# Patient Record
Sex: Male | Born: 1965 | Race: Black or African American | Hispanic: No | Marital: Single | Smoking: Smoker, current status unknown
Health system: Southern US, Community
[De-identification: ages and names within clinical notes are randomized; demographics above are authoritative.]

## PROBLEM LIST (undated history)

## (undated) DIAGNOSIS — F319 Bipolar disorder, unspecified: Secondary | ICD-10-CM

## (undated) DIAGNOSIS — R569 Unspecified convulsions: Secondary | ICD-10-CM

## (undated) DIAGNOSIS — I10 Essential (primary) hypertension: Secondary | ICD-10-CM

## (undated) DIAGNOSIS — G459 Transient cerebral ischemic attack, unspecified: Secondary | ICD-10-CM

## (undated) HISTORY — PX: APPENDECTOMY: SHX54

---

## 2011-11-23 DEATH — deceased

## 2016-05-15 ENCOUNTER — Encounter (HOSPITAL_COMMUNITY): Payer: Self-pay | Admitting: Emergency Medicine

## 2016-05-15 ENCOUNTER — Emergency Department (HOSPITAL_COMMUNITY): Payer: Self-pay

## 2016-05-15 ENCOUNTER — Inpatient Hospital Stay (HOSPITAL_COMMUNITY)
Admission: EM | Admit: 2016-05-15 | Discharge: 2016-05-17 | DRG: 880 | Disposition: A | Discharge status: Home / Self Care | Payer: Self-pay | Attending: Neurology | Admitting: Neurology

## 2016-05-15 DIAGNOSIS — G4700 Insomnia, unspecified: Secondary | ICD-10-CM | POA: Diagnosis present

## 2016-05-15 DIAGNOSIS — N189 Chronic kidney disease, unspecified: Secondary | ICD-10-CM | POA: Diagnosis present

## 2016-05-15 DIAGNOSIS — G839 Paralytic syndrome, unspecified: Secondary | ICD-10-CM | POA: Diagnosis present

## 2016-05-15 DIAGNOSIS — F17200 Nicotine dependence, unspecified, uncomplicated: Secondary | ICD-10-CM | POA: Diagnosis present

## 2016-05-15 DIAGNOSIS — N182 Chronic kidney disease, stage 2 (mild): Secondary | ICD-10-CM | POA: Diagnosis present

## 2016-05-15 DIAGNOSIS — I10 Essential (primary) hypertension: Secondary | ICD-10-CM | POA: Diagnosis present

## 2016-05-15 DIAGNOSIS — R531 Weakness: Secondary | ICD-10-CM

## 2016-05-15 DIAGNOSIS — Z720 Tobacco use: Secondary | ICD-10-CM | POA: Diagnosis present

## 2016-05-15 DIAGNOSIS — F444 Conversion disorder with motor symptom or deficit: Principal | ICD-10-CM | POA: Diagnosis present

## 2016-05-15 DIAGNOSIS — I63312 Cerebral infarction due to thrombosis of left middle cerebral artery: Secondary | ICD-10-CM

## 2016-05-15 DIAGNOSIS — R471 Dysarthria and anarthria: Secondary | ICD-10-CM | POA: Diagnosis present

## 2016-05-15 DIAGNOSIS — F1290 Cannabis use, unspecified, uncomplicated: Secondary | ICD-10-CM | POA: Diagnosis present

## 2016-05-15 DIAGNOSIS — Z8673 Personal history of transient ischemic attack (TIA), and cerebral infarction without residual deficits: Secondary | ICD-10-CM

## 2016-05-15 DIAGNOSIS — E785 Hyperlipidemia, unspecified: Secondary | ICD-10-CM | POA: Diagnosis present

## 2016-05-15 DIAGNOSIS — R2990 Unspecified symptoms and signs involving the nervous system: Secondary | ICD-10-CM | POA: Diagnosis present

## 2016-05-15 DIAGNOSIS — H53461 Homonymous bilateral field defects, right side: Secondary | ICD-10-CM | POA: Diagnosis present

## 2016-05-15 DIAGNOSIS — F319 Bipolar disorder, unspecified: Secondary | ICD-10-CM | POA: Diagnosis present

## 2016-05-15 DIAGNOSIS — E669 Obesity, unspecified: Secondary | ICD-10-CM | POA: Diagnosis present

## 2016-05-15 DIAGNOSIS — R4781 Slurred speech: Secondary | ICD-10-CM | POA: Diagnosis present

## 2016-05-15 DIAGNOSIS — I639 Cerebral infarction, unspecified: Secondary | ICD-10-CM

## 2016-05-15 DIAGNOSIS — I129 Hypertensive chronic kidney disease with stage 1 through stage 4 chronic kidney disease, or unspecified chronic kidney disease: Secondary | ICD-10-CM | POA: Diagnosis present

## 2016-05-15 DIAGNOSIS — R4701 Aphasia: Secondary | ICD-10-CM

## 2016-05-15 DIAGNOSIS — R29810 Facial weakness: Secondary | ICD-10-CM | POA: Diagnosis present

## 2016-05-15 DIAGNOSIS — F3130 Bipolar disorder, current episode depressed, mild or moderate severity, unspecified: Secondary | ICD-10-CM | POA: Diagnosis present

## 2016-05-15 DIAGNOSIS — Z79899 Other long term (current) drug therapy: Secondary | ICD-10-CM

## 2016-05-15 DIAGNOSIS — Z7982 Long term (current) use of aspirin: Secondary | ICD-10-CM

## 2016-05-15 DIAGNOSIS — F1210 Cannabis abuse, uncomplicated: Secondary | ICD-10-CM | POA: Diagnosis present

## 2016-05-15 DIAGNOSIS — R29719 NIHSS score 19: Secondary | ICD-10-CM | POA: Diagnosis present

## 2016-05-15 DIAGNOSIS — K219 Gastro-esophageal reflux disease without esophagitis: Secondary | ICD-10-CM | POA: Diagnosis present

## 2016-05-15 DIAGNOSIS — Z6833 Body mass index (BMI) 33.0-33.9, adult: Secondary | ICD-10-CM

## 2016-05-15 HISTORY — DX: Transient cerebral ischemic attack, unspecified: G45.9

## 2016-05-15 HISTORY — DX: Bipolar disorder, unspecified: F31.9

## 2016-05-15 LAB — URINALYSIS, ROUTINE W REFLEX MICROSCOPIC
Bilirubin Urine: NEGATIVE
GLUCOSE, UA: NEGATIVE mg/dL
KETONES UR: NEGATIVE mg/dL
LEUKOCYTES UA: NEGATIVE
Nitrite: NEGATIVE
PROTEIN: 30 mg/dL — AB
Specific Gravity, Urine: 1.027 (ref 1.005–1.030)
pH: 5.5 (ref 5.0–8.0)

## 2016-05-15 LAB — COMPREHENSIVE METABOLIC PANEL
ALBUMIN: 4.1 g/dL (ref 3.5–5.0)
ALT: 33 U/L (ref 17–63)
AST: 62 U/L — AB (ref 15–41)
Alkaline Phosphatase: 67 U/L (ref 38–126)
Anion gap: 5 (ref 5–15)
BILIRUBIN TOTAL: 0.7 mg/dL (ref 0.3–1.2)
BUN: 23 mg/dL — AB (ref 6–20)
CO2: 23 mmol/L (ref 22–32)
CREATININE: 1.48 mg/dL — AB (ref 0.61–1.24)
Calcium: 9 mg/dL (ref 8.9–10.3)
Chloride: 110 mmol/L (ref 101–111)
GFR calc Af Amer: >60 mL/min (ref >60)
GFR calc non Af Amer: 53 mL/min — ABNORMAL LOW (ref >60)
GLUCOSE: 127 mg/dL — AB (ref 65–99)
POTASSIUM: 3.9 mmol/L (ref 3.5–5.1)
Sodium: 138 mmol/L (ref 135–145)
TOTAL PROTEIN: 6.3 g/dL — AB (ref 6.5–8.1)

## 2016-05-15 LAB — URINE MICROSCOPIC-ADD ON: WBC UA: NONE SEEN WBC/hpf (ref 0–5)

## 2016-05-15 LAB — I-STAT CHEM 8, ED
BUN: 26 mg/dL — ABNORMAL HIGH (ref 6–20)
CHLORIDE: 108 mmol/L (ref 101–111)
CREATININE: 1.6 mg/dL — AB (ref 0.61–1.24)
Calcium, Ion: 1.15 mmol/L (ref 1.12–1.23)
GLUCOSE: 113 mg/dL — AB (ref 65–99)
HEMATOCRIT: 39 % (ref 39.0–52.0)
Hemoglobin: 13.3 g/dL (ref 13.0–17.0)
POTASSIUM: 4.8 mmol/L (ref 3.5–5.1)
Sodium: 143 mmol/L (ref 135–145)
TCO2: 22 mmol/L (ref 0–100)

## 2016-05-15 LAB — DIFFERENTIAL
BASOS ABS: 0 10*3/uL (ref 0.0–0.1)
Basophils Relative: 0 %
EOS ABS: 0 10*3/uL (ref 0.0–0.7)
Eosinophils Relative: 0 %
LYMPHS ABS: 1.4 10*3/uL (ref 0.7–4.0)
LYMPHS PCT: 24 %
Monocytes Absolute: 0.3 10*3/uL (ref 0.1–1.0)
Monocytes Relative: 5 %
NEUTROS ABS: 4 10*3/uL (ref 1.7–7.7)
NEUTROS PCT: 71 %

## 2016-05-15 LAB — CBC
HEMATOCRIT: 38.2 % — AB (ref 39.0–52.0)
HEMOGLOBIN: 12.8 g/dL — AB (ref 13.0–17.0)
MCH: 26.4 pg (ref 26.0–34.0)
MCHC: 33.5 g/dL (ref 30.0–36.0)
MCV: 78.9 fL (ref 78.0–100.0)
Platelets: 241 10*3/uL (ref 150–400)
RBC: 4.84 MIL/uL (ref 4.22–5.81)
RDW: 14.9 % (ref 11.5–15.5)
WBC: 5.7 10*3/uL (ref 4.0–10.5)

## 2016-05-15 LAB — RAPID URINE DRUG SCREEN, HOSP PERFORMED
Amphetamines: NOT DETECTED
BARBITURATES: NOT DETECTED
BENZODIAZEPINES: NOT DETECTED
COCAINE: NOT DETECTED
Opiates: NOT DETECTED
TETRAHYDROCANNABINOL: POSITIVE — AB

## 2016-05-15 LAB — PROTIME-INR
INR: 1.24 (ref 0.00–1.49)
Prothrombin Time: 15.7 seconds — ABNORMAL HIGH (ref 11.6–15.2)

## 2016-05-15 LAB — ETHANOL: Alcohol, Ethyl (B): <5 mg/dL (ref <5)

## 2016-05-15 LAB — APTT: APTT: 32 s (ref 24–37)

## 2016-05-15 LAB — CBG MONITORING, ED: GLUCOSE-CAPILLARY: 114 mg/dL — AB (ref 65–99)

## 2016-05-15 LAB — I-STAT TROPONIN, ED: Troponin i, poc: 0.01 ng/mL (ref 0.00–0.08)

## 2016-05-15 MED ORDER — SODIUM CHLORIDE 0.9 % IV BOLUS (SEPSIS)
500.0000 mL | Freq: Once | INTRAVENOUS | Status: AC
  Administered 2016-05-15: 500 mL via INTRAVENOUS

## 2016-05-15 MED ORDER — IOPAMIDOL (ISOVUE-370) INJECTION 76%
50.0000 mL | Freq: Once | INTRAVENOUS | Status: AC | PRN
  Administered 2016-05-15: 50 mL via INTRAVENOUS

## 2016-05-15 MED ORDER — ALTEPLASE (STROKE) FULL DOSE INFUSION
90.0000 mg | Freq: Once | INTRAVENOUS | Status: AC
  Administered 2016-05-15: 90 mg via INTRAVENOUS
  Filled 2016-05-15: qty 100

## 2016-05-15 MED ORDER — SODIUM CHLORIDE 0.9 % IV SOLN
Freq: Once | INTRAVENOUS | Status: AC
  Administered 2016-05-15: 23:00:00 via INTRAVENOUS

## 2016-05-15 NOTE — ED Notes (Signed)
CBG 114 

## 2016-05-15 NOTE — ED Notes (Signed)
Taking patient to [facility] now.

## 2016-05-15 NOTE — H&P (Signed)
Admission H&P    Chief Complaint: Acute onset of speech difficulty, facial droop and right-sided weakness.  HPI: Benjamin Moon is an 50 y.o. male with a history of depression and equivocal history of stroke and TIA, brought to the emergency room and code stroke status following acute onset of weakness involving his right side as well as difficulty with speech output and right facial droop. He was last known well at 8:42 PM. He's been taking aspirin daily. He reportedly had a stroke in 2014 and was treated with TPA with a very good outcome. CT scan of his head showed no acute intracranial abnormality other than equivocal hypodensity involving insular region. NIH stroke score was 19. Patient was deemed a candidate for TPA which was administered. He began to show improvement about 30-40 minutes after TPA was started. He he had a remarkable recovery with only mild sensory changes as residual. CT angiogram showed no signs of proximal occlusion or significant large vessel stenosis.  LSN: 8:42 PM on 05/15/2016 tPA Given: Yes mRankin:  Past Medical History  Diagnosis Date  . Bipolar 1 disorder ([place])   . TIA (transient ischemic attack)     History reviewed. No pertinent past surgical history.  Family history: Obtained and was noncontributory.  Social History:  reports that he has been smoking.  He does not have any smokeless tobacco history on file. He reports that he uses illicit drugs (Marijuana). He reports that he does not drink alcohol.  Allergies: No Known Allergies  Medications: Preadmission medications were reviewed by me.  ROS: History obtained from patient's sister and girlfriend.  General ROS: negative for - chills, fatigue, fever, night sweats, weight gain or weight loss Psychological ROS: negative for - behavioral disorder, hallucinations, memory difficulties, mood swings or suicidal ideation Ophthalmic ROS: negative for - blurry vision, double vision, eye pain or loss of  vision ENT ROS: negative for - epistaxis, nasal discharge, oral lesions, sore throat, tinnitus or vertigo Allergy and Immunology ROS: negative for - hives or itchy/watery eyes Hematological and Lymphatic ROS: negative for - bleeding problems, bruising or swollen lymph nodes Endocrine ROS: negative for - galactorrhea, hair pattern changes, polydipsia/polyuria or temperature intolerance Respiratory ROS: negative for - cough, hemoptysis, shortness of breath or wheezing Cardiovascular ROS: negative for - chest pain, dyspnea on exertion, edema or irregular heartbeat Gastrointestinal ROS: negative for - abdominal pain, diarrhea, hematemesis, nausea/vomiting or stool incontinence Genito-Urinary ROS: negative for - dysuria, hematuria, incontinence or urinary frequency/urgency Musculoskeletal ROS: negative for - joint swelling or muscular weakness Neurological ROS: as noted in HPI; history of tremor induced by medication; dyskinesias associated with use of Latuda Dermatological ROS: negative for rash and skin lesion changes  Physical Examination: Blood pressure 146/90, pulse 76, resp. rate 19, height 6' 3"  (1.905 m), weight 126.1 kg (278 lb), SpO2 99 %.  HEENT-  Normocephalic, no lesions, without obvious abnormality.  Normal external eye and conjunctiva.  Normal TM's bilaterally.  Normal auditory canals and external ears. Normal external nose, mucus membranes and septum.  Normal pharynx. Neck supple with no masses, nodes, nodules or enlargement. Cardiovascular - regular rate and rhythm, S1, S2 normal, no murmur, click, rub or gallop Lungs - chest clear, no wheezing, rales, normal symmetric air entry Abdomen - soft, non-tender; bowel sounds normal; no masses,  no organomegaly Extremities - no joint deformities, effusion, or inflammation and no edema  Neurologic Examination: (Prior to TPA administration) Mental Status: Alert, oriented to age and current month, no acute distress.  Speech  moderately  slurred with paraphasic errors and some word finding difficulty. Mild to moderate right side neglect also noted. Cranial Nerves: Dense right homonymous hemianopsia. III/IV/VI-Pupils were equal and reacted normally to light. Extraocular movements were full and conjugate with mild gaze preference to the left side.    VII-marked right lower facial weakness. VIII-normal. X-moderately dysarthric. XI: trapezius strength/neck flexion strength normal bilaterally XII-midline tongue extension with normal strength. Motor: Severe weakness of right upper and lower extremities with flaccid muscle tone; normal strength and tone of left extremities Sensory: Absent perception of tactile sensation over right extremities. Deep Tendon Reflexes: 2+ and symmetric. Plantars: Mute on the right and flexor on the left Carotid auscultation: Normal  Results for orders placed or performed during the hospital encounter of 05/15/16 (from the past 48 hour(s))  Protime-INR     Status: Abnormal   Collection Time: 05/15/16  9:30 PM  Result Value Ref Range   Prothrombin Time 15.7 (H) 11.6 - 15.2 seconds   INR 1.24 0.00 - 1.49  APTT     Status: None   Collection Time: 05/15/16  9:30 PM  Result Value Ref Range   aPTT 32 24 - 37 seconds  CBC     Status: Abnormal   Collection Time: 05/15/16  9:30 PM  Result Value Ref Range   WBC 5.7 4.0 - 10.5 K/uL   RBC 4.84 4.22 - 5.81 MIL/uL   Hemoglobin 12.8 (L) 13.0 - 17.0 g/dL   HCT 38.2 (L) 39.0 - 52.0 %   MCV 78.9 78.0 - 100.0 fL   MCH 26.4 26.0 - 34.0 pg   MCHC 33.5 30.0 - 36.0 g/dL   RDW 14.9 11.5 - 15.5 %   Platelets 241 150 - 400 K/uL  Differential     Status: None   Collection Time: 05/15/16  9:30 PM  Result Value Ref Range   Neutrophils Relative % 71 %   Neutro Abs 4.0 1.7 - 7.7 K/uL   Lymphocytes Relative 24 %   Lymphs Abs 1.4 0.7 - 4.0 K/uL   Monocytes Relative 5 %   Monocytes Absolute 0.3 0.1 - 1.0 K/uL   Eosinophils Relative 0 %   Eosinophils Absolute 0.0  0.0 - 0.7 K/uL   Basophils Relative 0 %   Basophils Absolute 0.0 0.0 - 0.1 K/uL  Comprehensive metabolic panel     Status: Abnormal   Collection Time: 05/15/16  9:30 PM  Result Value Ref Range   Sodium 138 135 - 145 mmol/L   Potassium 3.9 3.5 - 5.1 mmol/L   Chloride 110 101 - 111 mmol/L   CO2 23 22 - 32 mmol/L   Glucose, Bld 127 (H) 65 - 99 mg/dL   BUN 23 (H) 6 - 20 mg/dL   Creatinine, Ser 1.48 (H) 0.61 - 1.24 mg/dL   Calcium 9.0 8.9 - 10.3 mg/dL   Total Protein 6.3 (L) 6.5 - 8.1 g/dL   Albumin 4.1 3.5 - 5.0 g/dL   AST 62 (H) 15 - 41 U/L   ALT 33 17 - 63 U/L   Alkaline Phosphatase 67 38 - 126 U/L   Total Bilirubin 0.7 0.3 - 1.2 mg/dL   GFR calc non Af Amer 53 (L) >60 mL/min   GFR calc Af Amer >60 >60 mL/min    Comment: (NOTE) The eGFR has been calculated using the CKD EPI equation. This calculation has not been validated in all clinical situations. eGFR's persistently <60 mL/min signify possible Chronic Kidney Disease.    Anion  gap 5 5 - 15  Ethanol     Status: None   Collection Time: 05/15/16  9:36 PM  Result Value Ref Range   Alcohol, Ethyl (B) <5 <5 mg/dL    Comment:        LOWEST DETECTABLE LIMIT FOR SERUM ALCOHOL IS 5 mg/dL FOR MEDICAL PURPOSES ONLY   I-stat troponin, ED (not at [facility], [facility])     Status: None   Collection Time: 05/15/16  9:44 PM  Result Value Ref Range   Troponin i, poc 0.01 0.00 - 0.08 ng/mL   Comment 3            Comment: Due to the release kinetics of cTnI, a negative result within the first hours of the onset of symptoms does not rule out myocardial infarction with certainty. If myocardial infarction is still suspected, repeat the test at appropriate intervals.   I-Stat Chem 8, ED  (not at [facility], [facility])     Status: Abnormal   Collection Time: 05/15/16  9:46 PM  Result Value Ref Range   Sodium 143 135 - 145 mmol/L   Potassium 4.8 3.5 - 5.1 mmol/L   Chloride 108 101 - 111 mmol/L   BUN 26 (H) 6 - 20 mg/dL   Creatinine, Ser 1.60 (H) 0.61 - 1.24  mg/dL   Glucose, Bld 113 (H) 65 - 99 mg/dL   Calcium, Ion 1.15 1.12 - 1.23 mmol/L   TCO2 22 0 - 100 mmol/L   Hemoglobin 13.3 13.0 - 17.0 g/dL   HCT 39.0 39.0 - 52.0 %  CBG monitoring, ED     Status: Abnormal   Collection Time: 05/15/16 10:03 PM  Result Value Ref Range   Glucose-Capillary 114 (H) 65 - 99 mg/dL  Urine rapid drug screen (hosp performed)not at [facility]     Status: Abnormal   Collection Time: 05/15/16 10:08 PM  Result Value Ref Range   Opiates NONE DETECTED NONE DETECTED   Cocaine NONE DETECTED NONE DETECTED   Benzodiazepines NONE DETECTED NONE DETECTED   Amphetamines NONE DETECTED NONE DETECTED   Tetrahydrocannabinol POSITIVE (A) NONE DETECTED   Barbiturates NONE DETECTED NONE DETECTED    Comment:        DRUG SCREEN FOR MEDICAL PURPOSES ONLY.  IF CONFIRMATION IS NEEDED FOR ANY PURPOSE, NOTIFY LAB WITHIN 5 DAYS.        LOWEST DETECTABLE LIMITS FOR URINE DRUG SCREEN Drug Class       Cutoff (ng/mL) Amphetamine      1000 Barbiturate      200 Benzodiazepine   767 Tricyclics       209 Opiates          300 Cocaine          300 THC              50   Urinalysis, Routine w reflex microscopic (not at [facility])     Status: Abnormal   Collection Time: 05/15/16 10:09 PM  Result Value Ref Range   Color, Urine YELLOW YELLOW   APPearance CLEAR CLEAR   Specific Gravity, Urine 1.027 1.005 - 1.030   pH 5.5 5.0 - 8.0   Glucose, UA NEGATIVE NEGATIVE mg/dL   Hgb urine dipstick TRACE (A) NEGATIVE   Bilirubin Urine NEGATIVE NEGATIVE   Ketones, ur NEGATIVE NEGATIVE mg/dL   Protein, ur 30 (A) NEGATIVE mg/dL   Nitrite NEGATIVE NEGATIVE   Leukocytes, UA NEGATIVE NEGATIVE  Urine microscopic-add on     Status: Abnormal   Collection  Time: 05/15/16 10:09 PM  Result Value Ref Range   Squamous Epithelial / LPF 0-5 (A) NONE SEEN   WBC, UA NONE SEEN 0 - 5 WBC/hpf   RBC / HPF 0-5 0 - 5 RBC/hpf   Bacteria, UA RARE (A) NONE SEEN   Ct Head Wo Contrast  05/15/2016  CLINICAL DATA:  Code stroke.   Slurred speech.  Right-sided weakness. EXAM: CT HEAD WITHOUT CONTRAST TECHNIQUE: Contiguous axial images were obtained from the base of the skull through the vertex without intravenous contrast. COMPARISON:  None. FINDINGS: New large or definite infarction. Question slight loss of the cortical ribbon in the insular region on the left. No evidence of hemorrhage. No mass effect or shift. Calvarium is unremarkable. Sinuses, middle ears and mastoids are clear. IMPRESSION: No definite acute insult. Question slight loss of cortical ribbon in the insular region on the left. Possible aspects 9. I am attempting to call this report to the stroke neurologist but there is no answer at this time. Electronically Signed   By: Nelson Chimes M.D.   On: 05/15/2016 21:52    Assessment: 50 y.o. 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  Stroke Risk Factors - equivocal history of hypertension, smoking  Plan: 1. HgbA1c, fasting lipid panel 2. MRI of the brain without contrast 3. PT consult, OT consult, Speech consult 4. Echocardiogram 5. Prophylactic therapy-Antiplatelet med: Aspirin if CT scan 24 hours post TPA administration shows no acute intracranial hemorrhage 6. Risk factor modification 7. Telemetry monitoring  This patient is critically ill and at significant risk of neurological worsening or death, and care requires constant monitoring of vital signs, hemodynamics,respiratory and cardiac monitoring, neurological assessment, discussion with family, other specialists and medical decision making of high complexity. Total critical care time was 90 minutes.  C.R. Nicole Kindred, MD [facility] [phone]  05/15/2016, 10:55 PM

## 2016-05-15 NOTE — ED Notes (Signed)
GF reported patient was in bed at 2042 and started twitching on left side and shaking. Called EMS. EMS arrived patient right side flaccid, muscle twitching, expressive aphasia, delayed responses. Patient has history of bipolar disorder on Latuda for 2 weeks, onset on muscle twitching unknown. Patient arrives to ED, right side flaccid, complains of head ache. Patient unable to answer questions. Tongue deviation to right side, right facial droop, pupils equal and reactive. Hx TIA, bipolar disorder. Patient went straight to CT at 2135. [facility]. 20 g in Left AC. Neuro at bedside, orders to administer TPA. Pharmacy at bedside. Catheter placed.

## 2016-05-15 NOTE — ED Provider Notes (Signed)
CSN: [id number]     Arrival date & time 05/15/16  2132 History   First MD Initiated Contact with Patient 05/15/16 2158     Chief Complaint  Patient presents with  . Code Stroke     (Consider location/radiation/quality/duration/timing/severity/associated sxs/prior Treatment) HPI Comments: 50 year old male with past medical history including bipolar disorder and TIA who presents with altered mental status. History obtained from EMS. EMS states that the patient's girlfriend reported that the patient was in bed at 20:42 this evening when he began twitching on his left side and shaking. She called EMS. When EMS arrived, they found the patient with dense right-sided weakness involving arms and leg, as well as aphasia. Girlfriend noted that the patient was recently started on Latuda 2 weeks ago. Patient has complained of headache that otherwise unable to answer questions.  LEVEL 5 CAVEAT DUE TO AMS  The history is provided by the EMS personnel.    Past Medical History  Diagnosis Date  . Bipolar 1 disorder ([facility])   . TIA (transient ischemic attack)    History reviewed. No pertinent past surgical history. No family history on file. Social History  Substance Use Topics  . Smoking status: Smoker, Current Status Unknown  . Smokeless tobacco: None  . Alcohol Use: No    Review of Systems  Unable to perform ROS: Mental status change      Allergies  Review of patient's allergies indicates no known allergies.  Home Medications   Prior to Admission medications   Medication Sig Start Date End Date Taking? Authorizing Provider  aspirin 81 MG chewable tablet Chew 81 mg by mouth daily.   Yes Historical Provider, MD  LamoTRIgine (LAMICTAL PO) Take by mouth.   Yes Historical Provider, MD  lurasidone (LATUDA) 80 MG TABS tablet Take 80 mg by mouth every evening.   Yes Historical Provider, MD  omeprazole (PRILOSEC OTC) 20 MG tablet Take 20 mg by mouth daily.   Yes Historical Provider, MD   traZODone (DESYREL) 50 MG tablet Take 50 mg by mouth at bedtime as needed for sleep. 1 to 2 tablets at bedtime as needed   Yes Historical Provider, MD   Pulse 79  Ht 6\' 3"  (1.905 m)  Wt 278 lb (126.1 kg)  BMI 34.75 kg/m2  SpO2 100% Physical Exam  Constitutional: He appears well-developed and well-nourished.  Aphasic, Awake, alert  HENT:  Head: Normocephalic and atraumatic.  Eyes: Conjunctivae and EOM are normal. Pupils are equal, round, and reactive to light.  Neck: Neck supple.  Cardiovascular: Normal rate, regular rhythm and normal heart sounds.   No murmur heard. Pulmonary/Chest: Effort normal and breath sounds normal. No respiratory distress.  Abdominal: Soft. Bowel sounds are normal. He exhibits no distension.  Musculoskeletal: He exhibits no edema.  Neurological: He is alert. He has normal reflexes.  Expressive aphasia, able to follow some basic commands, R facial droop, tongue deviation to R, R hemianopsia, 2/5 strength RUE and RLE; 5/5 strength LUE and LLE  Skin: Skin is warm and dry.  Nursing note and vitals reviewed.   ED Course  .Critical Care Performed by: Laurence SpatesLITTLE, Arianie Couse MORGAN Authorized by: Laurence SpatesLITTLE, Shaheen Star MORGAN Total critical care time: 45 minutes Critical care was necessary to treat or prevent imminent or life-threatening deterioration of the following conditions: CNS failure or compromise. Critical care was time spent personally by me on the following activities: discussions with consultants, evaluation of patient's response to treatment, examination of patient, obtaining history from patient or surrogate, ordering and performing  treatments and interventions, ordering and review of laboratory studies, ordering and review of radiographic studies and re-evaluation of patient's condition.   (including critical care time) Labs Review Labs Reviewed  PROTIME-INR - Abnormal; Notable for the following:    Prothrombin Time 15.7 (*)    All other components within normal  limits  CBC - Abnormal; Notable for the following:    Hemoglobin 12.8 (*)    HCT 38.2 (*)    All other components within normal limits  COMPREHENSIVE METABOLIC PANEL - Abnormal; Notable for the following:    Glucose, Bld 127 (*)    BUN 23 (*)    Creatinine, Ser 1.48 (*)    Total Protein 6.3 (*)    AST 62 (*)    GFR calc non Af Amer 53 (*)    All other components within normal limits  URINE RAPID DRUG SCREEN, HOSP PERFORMED - Abnormal; Notable for the following:    Tetrahydrocannabinol POSITIVE (*)    All other components within normal limits  URINALYSIS, ROUTINE W REFLEX MICROSCOPIC (NOT AT [facility]) - Abnormal; Notable for the following:    Hgb urine dipstick TRACE (*)    Protein, ur 30 (*)    All other components within normal limits  URINE MICROSCOPIC-ADD ON - Abnormal; Notable for the following:    Squamous Epithelial / LPF 0-5 (*)    Bacteria, UA RARE (*)    All other components within normal limits  I-STAT CHEM 8, ED - Abnormal; Notable for the following:    BUN 26 (*)    Creatinine, Ser 1.60 (*)    Glucose, Bld 113 (*)    All other components within normal limits  CBG MONITORING, ED - Abnormal; Notable for the following:    Glucose-Capillary 114 (*)    All other components within normal limits  ETHANOL  APTT  DIFFERENTIAL  I-STAT TROPOININ, ED    Imaging Review Ct Head Wo Contrast  05/15/2016  CLINICAL DATA:  Code stroke.  Slurred speech.  Right-sided weakness. EXAM: CT HEAD WITHOUT CONTRAST TECHNIQUE: Contiguous axial images were obtained from the base of the skull through the vertex without intravenous contrast. COMPARISON:  None. FINDINGS: New large or definite infarction. Question slight loss of the cortical ribbon in the insular region on the left. No evidence of hemorrhage. No mass effect or shift. Calvarium is unremarkable. Sinuses, middle ears and mastoids are clear. IMPRESSION: No definite acute insult. Question slight loss of cortical ribbon in the insular region  on the left. Possible aspects 9. I am attempting to call this report to the stroke neurologist but there is no answer at this time. Electronically Signed   By: Paulina Fusi M.D.   On: 05/15/2016 21:52   I have personally reviewed and evaluated these lab results as part of my medical decision-making.   EKG Interpretation None     Medications  alteplase (ACTIVASE) 1 mg/mL infusion 90 mg (not administered)    MDM   Final diagnoses:  Acute ischemic stroke [facility])  Aphasia  Right sided weakness   Patient with sudden onset of aphasia and right-sided weakness witnessed by girlfriend tonight. He was made a stroke alert by EMS and on arrival, was taken immediately to CT scanner. CT was negative acute. On exam, he had expressive aphasia, right facial droop, right tongue deviation, right visual field loss, and dense hemiparesis involving right side of body. Dr. Roseanne Reno, neurology was at bedside on arrival and based on severity of sx and clear head CT,  recommended tPA. tPA was administered and Dr. Roseanne Reno will contact IR to discuss interventional options. Pt admitted for further care.  Laurence Spates, MD 05/16/16 [phone]

## 2016-05-15 NOTE — ED Notes (Signed)
NIH scaled done before TPA administration.

## 2016-05-15 NOTE — Code Documentation (Signed)
50 year old [facility] as Code stroke.  Girlfriend reports he was lying in bed watching TV - all was normal when he had a sudden onset of headache and left side shaking.  EMS reports right side flacid expressive aphasia.  On arrival to ED right tongue deviation, right   hemiplegia, right field cut , expressive aphasia - NIHHS 19.  Dr. Roseanne RenoStewart at bedside - CT scan done.  tPA ordered.  CBG 114.  BP stable.  tPA initiated at 2204.  See flowsheet for times.  IR contacted per Dr. Roseanne RenoStewart - for angiogram on their arrival.  Patient began to show improvement in right hemiplegia around 2233. No complications from tPA.   Dr. Roseanne RenoStewart present.  Handoff to W. R. BerkleyCassie RN.

## 2016-05-16 ENCOUNTER — Inpatient Hospital Stay (HOSPITAL_COMMUNITY): Payer: Self-pay

## 2016-05-16 ENCOUNTER — Inpatient Hospital Stay (HOSPITAL_COMMUNITY): Payer: MEDICAID

## 2016-05-16 DIAGNOSIS — F17200 Nicotine dependence, unspecified, uncomplicated: Secondary | ICD-10-CM

## 2016-05-16 DIAGNOSIS — F3130 Bipolar disorder, current episode depressed, mild or moderate severity, unspecified: Secondary | ICD-10-CM

## 2016-05-16 DIAGNOSIS — E785 Hyperlipidemia, unspecified: Secondary | ICD-10-CM

## 2016-05-16 DIAGNOSIS — R2990 Unspecified symptoms and signs involving the nervous system: Secondary | ICD-10-CM | POA: Diagnosis present

## 2016-05-16 DIAGNOSIS — I6789 Other cerebrovascular disease: Secondary | ICD-10-CM

## 2016-05-16 DIAGNOSIS — F444 Conversion disorder with motor symptom or deficit: Secondary | ICD-10-CM | POA: Diagnosis present

## 2016-05-16 LAB — LIPID PANEL
CHOL/HDL RATIO: 4.9 ratio
CHOLESTEROL: 172 mg/dL (ref 0–200)
HDL: 35 mg/dL — ABNORMAL LOW (ref >40)
LDL Cholesterol: 123 mg/dL — ABNORMAL HIGH (ref 0–99)
Triglycerides: 71 mg/dL (ref <150)
VLDL: 14 mg/dL (ref 0–40)

## 2016-05-16 LAB — ECHOCARDIOGRAM COMPLETE
HEIGHTINCHES: 75 in
WEIGHTICAEL: 4342.18 [oz_av]

## 2016-05-16 LAB — MRSA PCR SCREENING: MRSA BY PCR: NEGATIVE

## 2016-05-16 MED ORDER — PANTOPRAZOLE SODIUM 40 MG IV SOLR
40.0000 mg | Freq: Every day | INTRAVENOUS | Status: DC
  Administered 2016-05-16: 40 mg via INTRAVENOUS
  Filled 2016-05-16: qty 40

## 2016-05-16 MED ORDER — ACETAMINOPHEN 650 MG RE SUPP: 650.0000 mg | RECTAL | Status: DC | PRN

## 2016-05-16 MED ORDER — SODIUM CHLORIDE 0.9 % IV SOLN
INTRAVENOUS | Status: DC
  Administered 2016-05-16 (×2): via INTRAVENOUS

## 2016-05-16 MED ORDER — STROKE: EARLY STAGES OF RECOVERY BOOK
Freq: Once | Status: AC
  Administered 2016-05-16: 02:00:00
  Filled 2016-05-16: qty 1

## 2016-05-16 MED ORDER — QUETIAPINE FUMARATE ER 300 MG PO TB24
300.0000 mg | ORAL_TABLET | Freq: Every day | ORAL | Status: DC
  Administered 2016-05-16: 300 mg via ORAL
  Filled 2016-05-16: qty 1

## 2016-05-16 MED ORDER — ASPIRIN 325 MG PO TABS
325.0000 mg | ORAL_TABLET | Freq: Every day | ORAL | Status: DC
  Administered 2016-05-16 – 2016-05-17 (×2): 325 mg via ORAL
  Filled 2016-05-16 (×2): qty 1

## 2016-05-16 MED ORDER — ATORVASTATIN CALCIUM 20 MG PO TABS
20.0000 mg | ORAL_TABLET | Freq: Every day | ORAL | Status: DC
  Administered 2016-05-16: 20 mg via ORAL
  Filled 2016-05-16: qty 1

## 2016-05-16 MED ORDER — ATORVASTATIN CALCIUM 20 MG PO TABS: 20.0000 mg | ORAL_TABLET | Freq: Every day | ORAL | Status: DC

## 2016-05-16 MED ORDER — TRAZODONE HCL 50 MG PO TABS
50.0000 mg | ORAL_TABLET | Freq: Every day | ORAL | Status: DC
  Administered 2016-05-16: 50 mg via ORAL
  Filled 2016-05-16: qty 1

## 2016-05-16 MED ORDER — ACETAMINOPHEN 325 MG PO TABS
650.0000 mg | ORAL_TABLET | ORAL | Status: DC | PRN
  Administered 2016-05-16: 650 mg via ORAL
  Filled 2016-05-16: qty 2

## 2016-05-16 MED ORDER — LABETALOL HCL 5 MG/ML IV SOLN: 10.0000 mg | INTRAVENOUS | Status: DC | PRN

## 2016-05-16 NOTE — Progress Notes (Signed)
Roseanne RenoStewart notified of 24hr post tpa MRI results.  Ordered to start ASA 325 daily. Will continue to monitor.

## 2016-05-16 NOTE — Progress Notes (Signed)
Patient and sister informed RN that patient has had previous TIAs and has even received tpa. The last time he was hospitalized for stroke symptoms was about 1.5 years ago and the patient has h/o suicide attempts with most recent one occurring about 1.5 years ago. Patient has a history of conversion disorder. Two weeks ago patient was changed to Latuda from Seroquel d/t weight gain with Seroquel. Sister reports that since taking Latuda, patient has had symptoms of muscle twitching and tongue thrusting. She reports that they are in the process of changing back to Seroquel but are awaiting a taper schedule from the patient's psychiatrist. The sister also stated that yesterday the patient reported feeling "anxious again" prior to start of stroke symptoms.

## 2016-05-16 NOTE — Progress Notes (Signed)
STROKE TEAM PROGRESS NOTE   HISTORY OF PRESENT ILLNESS (per record) Benjamin Moon is an 50 y.o. male with a history of depression and equivocal history of stroke and TIA, brought to the emergency room and code stroke status following acute onset of weakness involving his right side as well as difficulty with speech output and right facial droop. He was last known well at 8:42 PM 05/15/2016. He's been taking aspirin daily. He reportedly had a stroke in 2014 and was treated with TPA with a very good outcome. CT scan of his head showed no acute intracranial abnormality other than equivocal hypodensity involving insular region. NIH stroke score was 19. Patient was deemed a candidate for TPA which was administered. He began to show improvement about 30-40 minutes after TPA was started. He he had a remarkable recovery with only mild sensory changes as residual. CT angiogram showed no signs of proximal occlusion or significant large vessel stenosis. He was admitted to the neuro ICU for further evaluation and treatment.   SUBJECTIVE (INTERVAL HISTORY) His RN is at the bedside.  Overall he feels his condition is completely resolved. He is back to his baseline and he thinks the tPA worked fine for him again. He had exact same symptoms 2 years ago in [place] and received tPA and his symptoms resolved in 4-5 days and MRI was reported negative. 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 He had multiple recent stress including father is hospitalized in [place], recent relationship with girlfriend and financial difficulty.    OBJECTIVE Temp:  [97.8 F (36.6 C)-98.4 F (36.9 C)] 97.8 F (36.6 C) (05/25 0400) Pulse Rate:  [62-83] 64 (05/25 0700) Cardiac Rhythm:  [-] Normal sinus rhythm (05/25 0700) Resp:  [10-20] 14 (05/25 0700) BP: (107-162)/(56-100) 131/79 mmHg (05/25 0700) SpO2:  [94 %-100 %] 97 % (05/25 0700) Weight:  [123.1 kg (271 lb 6.2 oz)-126.1 kg (278 lb)] 123.1 kg (271 lb 6.2 oz)  (05/25 0215)  CBC:  Recent Labs Lab 05/15/16 2130 05/15/16 2146  WBC 5.7  --   NEUTROABS 4.0  --   HGB 12.8* 13.3  HCT 38.2* 39.0  MCV 78.9  --   PLT 241  --     Basic Metabolic Panel:  Recent Labs Lab 05/15/16 2130 05/15/16 2146  NA 138 143  K 3.9 4.8  CL 110 108  CO2 23  --   GLUCOSE 127* 113*  BUN 23* 26*  CREATININE 1.48* 1.60*  CALCIUM 9.0  --     Lipid Panel:    Component Value Date/Time   CHOL 172 05/16/2016 0419   TRIG 71 05/16/2016 0419   HDL 35* 05/16/2016 0419   CHOLHDL 4.9 05/16/2016 0419   VLDL 14 05/16/2016 0419   LDLCALC 123* 05/16/2016 0419   HgbA1c: No results found for: HGBA1C Urine Drug Screen:    Component Value Date/Time   LABOPIA NONE DETECTED 05/15/2016 2208   COCAINSCRNUR NONE DETECTED 05/15/2016 2208   LABBENZ NONE DETECTED 05/15/2016 2208   AMPHETMU NONE DETECTED 05/15/2016 2208   THCU POSITIVE* 05/15/2016 2208   LABBARB NONE DETECTED 05/15/2016 2208      IMAGING  Ct Angio Head and neck W/cm &/or Wo Cm  05/16/2016  IMPRESSION: CTA NECK:  Negative. CTA HEAD: No emergent large vessel occlusion or significant stenosis. Mild intracranial atherosclerosis.   Ct Head Wo Contrast  05/15/2016  IMPRESSION: No definite acute insult. Question slight loss of cortical ribbon in the insular region on the left. Possible  aspects 9.   MRI - pending  TTE -  - Left ventricle: The cavity size was normal. Wall thickness was  normal. Systolic function was normal. The estimated ejection  fraction was in the range of 60% to 65%. Wall motion was normal;  there were no regional wall motion abnormalities. - Right atrium: The atrium was mildly dilated.    PHYSICAL EXAM  Temp:  [97.4 F (36.3 C)-98.4 F (36.9 C)] 97.9 F (36.6 C) (05/25 1600) Pulse Rate:  [56-83] 56 (05/25 1700) Resp:  [10-22] 11 (05/25 1700) BP: (107-162)/(56-100) 145/85 mmHg (05/25 1700) SpO2:  [94 %-100 %] 99 % (05/25 1700) Weight:  [271 lb 6.2 oz (123.1 kg)-278 lb  (126.1 kg)] 271 lb 6.2 oz (123.1 kg) (05/25 0215)  General - Well nourished, well developed, in no apparent distress.  Ophthalmologic - Sharp disc margins OU.   Cardiovascular - Regular rate and rhythm with no murmur.  Mental Status -  Level of arousal and orientation to time, place, and person were intact. Language including expression, naming, repetition, comprehension was assessed and found intact. Fund of Knowledge was assessed and was intact.  Cranial Nerves II - XII - II - Visual field intact OU. III, IV, VI - Extraocular movements intact. V - Facial sensation intact bilaterally. VII - Facial movement intact bilaterally. VIII - Hearing & vestibular intact bilaterally. X - Palate elevates symmetrically. XI - Chin turning & shoulder shrug intact bilaterally. XII - Tongue protrusion intact.  Motor Strength - The patient's strength was normal in all extremities and pronator drift was absent.  Bulk was normal and fasciculations were absent.   Motor Tone - Muscle tone was assessed at the neck and appendages and was normal.  Reflexes - The patient's reflexes were 1+ in all extremities and he had no pathological reflexes.  Sensory - Light touch, temperature/pinprick were assessed and were symmetrical.    Coordination - The patient had normal movements in the hands and feet with no ataxia or dysmetria.  Tremor was absent.  Gait and Station - not tested as TTE tech is performing test.   ASSESSMENT/PLAN [name] is a 50 y.o. male with history of bipolar d/o and ?stroke/TIA s/p tPA presenting with speech difficulty, facial droop and right-sided weakness. He received IV t-PA 05/15/2016 at 2204.   Likely conversion disorder - hx of conversion disorder 2015 and severe stroke symptoms cleared over night  Resultant  Deficits resolved  CTA H&N  Unremarkable   MRI  pending  2D Echo  unremarkable  LDL 123  HgbA1c pending  SCDs for VTE prophylaxis  Diet Heart Room  service appropriate?: Yes; Fluid consistency:: Thin  aspirin 81 mg daily prior to admission, now on No antithrombotic as within 24h of tPA.   Ongoing aggressive stroke risk factor management  Therapy recommendations:  pending   Disposition:  pending   Appreciate psych consult  Resume seroquel and d/c latuda  outpt pschy consult  Resume trazodone  Hyperlipidemia  Home meds:  No statin  LDL 123, goal < 100  Add lipitor 20mg   Continue statin at discharge  Tobacco abuse  Current smoker  Smoking cessation counseling provided  Pt is willing to quit  Other Stroke Risk Factors  THC use, UDS positive this admission  Obesity, Body mass index is 33.92 kg/(m^2).   Other Active Problems  CKD, Cr 1.6  Bipolar d/o   GERD on prilosec  Hospital day # 0  This patient is critically ill due to s/p  tPA and at significant risk of neurological worsening, death form hemorrhage. This patient's care requires constant monitoring of vital signs, hemodynamics, respiratory and cardiac monitoring, review of multiple databases, neurological assessment, discussion with family, other specialists and medical decision making of high complexity. I spent 35 minutes of neurocritical care time in the care of this patient.  Marvel PlanJindong Suresh Audi, MD PhD Stroke Neurology 05/16/2016 6:49 PM      To contact Stroke Continuity provider, please refer to [email or web address]. After hours, contact General Neurology

## 2016-05-16 NOTE — Progress Notes (Signed)
  Echocardiogram 2D Echocardiogram has been performed.  Delcie RochENNINGTON, Jadea Shiffer 05/16/2016, 9:56 AM

## 2016-05-16 NOTE — Progress Notes (Signed)
[id number] paged neurology regarding stroke swallow screen. Pt requesting food and drink. NIH is now a 1. ED nurse stated she had failed pt due to tongue deviation. Orders given to repeat swallow screen per Dr. Roseanne RenoStewart. Orders for heart healthy diet if patient passes.

## 2016-05-16 NOTE — Consult Note (Signed)
[facility] Consult   Reason for Consult:  Conversion disorder with neurological deficits Referring Physician:  Dr. Erlinda Hong Patient Identification: Benjamin Moon MRN:  [id number] Principal Diagnosis: Conversion disorder with weakness or paralysis, acute episode, with psychological stressor Diagnosis:   Patient Active Problem List   Diagnosis Date Noted  . CVA (cerebral infarction) [I63.9] 05/16/2016  . Conversion disorder with weakness or paralysis, acute episode, with psychological stressor [F44.4] 05/16/2016    Total Time spent with patient: 1 hour  Subjective:   [name] is a 50 y.o. male patient admitted with conversion disorder with neurological deficits.  HPI:  Benjamin Moon is an 50 y.o. Male, Seen, chart reviewed for psychiatric consultation and evaluation of bipolar depression, conversion disorder with weakness or paralysis, acute episode with the psychological stresses. Patient presented to the [facility] with the symptoms of stroke, acute onset of weakness involving the right side as well as some difficulty with the speech output and right facial droop and required TPA treatment. Patient reported he has been diagnosed with bipolar disorder and conversion disorder and recently his outpatient psychiatrist at [facility], [place] titrated Seroquel XR to [place] because of weight gain associated with Seroquel and also running out of the program which provides free medication. Patient reportedly started having extrapyramidal symptoms like motor twitches and also tongue trusting. Patient reported he cannot stick out his tongue and also difficult to talk. Patient did also has a multiple psychosocial stresses related to relationship with his girlfriend 6 months, biological father being in a hospital in [place] and has a plan of traveling, financial difficulties and a middle of switching his medication which he believes caused conversion disorder.  Patient reportedly has 3 different episodes of conversion disorders in the past while living in [place] area. Patient denies active suicidal/homicidal ideation, intention or plans and also requested to change his medication back to Seroquel which he was taking for the last 10 years and stable emotionally. Patient urine drug screen is positive for tetrahydrocannabinol.  Past Psychiatric History: Bipolar disorder and conversion disorder and also history of acute psychiatric hospitalization while living in [place] area in [place].   Risk to Self: Is patient at risk for suicide?: No Risk to Others:   Prior Inpatient Therapy:   Prior Outpatient Therapy:    Past Medical History:  Past Medical History  Diagnosis Date  . Bipolar 1 disorder ([place])   . TIA (transient ischemic attack)    History reviewed. No pertinent past surgical history. Family History: No family history on file. Family Psychiatric  History: Unknown  Social History:  History  Alcohol Use No     History  Drug Use  . Yes  . Special: Marijuana    Social History   Social History  . Marital Status: N/A    Spouse Name: N/A  . Number of Children: N/A  . Years of Education: N/A   Social History Main Topics  . Smoking status: Smoker, Current Status Unknown  . Smokeless tobacco: None  . Alcohol Use: No  . Drug Use: Yes    Special: Marijuana  . Sexual Activity: Not Asked   Other Topics Concern  . None   Social History Narrative  . None   Additional Social History:    Allergies:  No Known Allergies  Labs:  Results for orders placed or performed during the hospital encounter of 05/15/16 (from the past 48 hour(s))  Protime-INR     Status: Abnormal   Collection Time: 05/15/16  9:30 PM  Result Value Ref Range   Prothrombin Time 15.7 (H) 11.6 - 15.2 seconds   INR 1.24 0.00 - 1.49  APTT     Status: None   Collection Time: 05/15/16  9:30 PM  Result Value Ref Range   aPTT 32 24 - 37 seconds  CBC     Status:  Abnormal   Collection Time: 05/15/16  9:30 PM  Result Value Ref Range   WBC 5.7 4.0 - 10.5 K/uL   RBC 4.84 4.22 - 5.81 MIL/uL   Hemoglobin 12.8 (L) 13.0 - 17.0 g/dL   HCT 38.2 (L) 39.0 - 52.0 %   MCV 78.9 78.0 - 100.0 fL   MCH 26.4 26.0 - 34.0 pg   MCHC 33.5 30.0 - 36.0 g/dL   RDW 14.9 11.5 - 15.5 %   Platelets 241 150 - 400 K/uL  Differential     Status: None   Collection Time: 05/15/16  9:30 PM  Result Value Ref Range   Neutrophils Relative % 71 %   Neutro Abs 4.0 1.7 - 7.7 K/uL   Lymphocytes Relative 24 %   Lymphs Abs 1.4 0.7 - 4.0 K/uL   Monocytes Relative 5 %   Monocytes Absolute 0.3 0.1 - 1.0 K/uL   Eosinophils Relative 0 %   Eosinophils Absolute 0.0 0.0 - 0.7 K/uL   Basophils Relative 0 %   Basophils Absolute 0.0 0.0 - 0.1 K/uL  Comprehensive metabolic panel     Status: Abnormal   Collection Time: 05/15/16  9:30 PM  Result Value Ref Range   Sodium 138 135 - 145 mmol/L   Potassium 3.9 3.5 - 5.1 mmol/L   Chloride 110 101 - 111 mmol/L   CO2 23 22 - 32 mmol/L   Glucose, Bld 127 (H) 65 - 99 mg/dL   BUN 23 (H) 6 - 20 mg/dL   Creatinine, Ser 1.48 (H) 0.61 - 1.24 mg/dL   Calcium 9.0 8.9 - 10.3 mg/dL   Total Protein 6.3 (L) 6.5 - 8.1 g/dL   Albumin 4.1 3.5 - 5.0 g/dL   AST 62 (H) 15 - 41 U/L   ALT 33 17 - 63 U/L   Alkaline Phosphatase 67 38 - 126 U/L   Total Bilirubin 0.7 0.3 - 1.2 mg/dL   GFR calc non Af Amer 53 (L) >60 mL/min   GFR calc Af Amer >60 >60 mL/min    Comment: (NOTE) The eGFR has been calculated using the CKD EPI equation. This calculation has not been validated in all clinical situations. eGFR's persistently <60 mL/min signify possible Chronic Kidney Disease.    Anion gap 5 5 - 15  Ethanol     Status: None   Collection Time: 05/15/16  9:36 PM  Result Value Ref Range   Alcohol, Ethyl (B) <5 <5 mg/dL    Comment:        LOWEST DETECTABLE LIMIT FOR SERUM ALCOHOL IS 5 mg/dL FOR MEDICAL PURPOSES ONLY   I-stat troponin, ED (not at [facility], [facility])      Status: None   Collection Time: 05/15/16  9:44 PM  Result Value Ref Range   Troponin i, poc 0.01 0.00 - 0.08 ng/mL   Comment 3            Comment: Due to the release kinetics of cTnI, a negative result within the first hours of the onset of symptoms does not rule out myocardial infarction with certainty. If myocardial infarction is still suspected, repeat the test at appropriate  intervals.   I-Stat Chem 8, ED  (not at [facility], [facility])     Status: Abnormal   Collection Time: 05/15/16  9:46 PM  Result Value Ref Range   Sodium 143 135 - 145 mmol/L   Potassium 4.8 3.5 - 5.1 mmol/L   Chloride 108 101 - 111 mmol/L   BUN 26 (H) 6 - 20 mg/dL   Creatinine, Ser 1.60 (H) 0.61 - 1.24 mg/dL   Glucose, Bld 113 (H) 65 - 99 mg/dL   Calcium, Ion 1.15 1.12 - 1.23 mmol/L   TCO2 22 0 - 100 mmol/L   Hemoglobin 13.3 13.0 - 17.0 g/dL   HCT 39.0 39.0 - 52.0 %  CBG monitoring, ED     Status: Abnormal   Collection Time: 05/15/16 10:03 PM  Result Value Ref Range   Glucose-Capillary 114 (H) 65 - 99 mg/dL  Urine rapid drug screen (hosp performed)not at [facility]     Status: Abnormal   Collection Time: 05/15/16 10:08 PM  Result Value Ref Range   Opiates NONE DETECTED NONE DETECTED   Cocaine NONE DETECTED NONE DETECTED   Benzodiazepines NONE DETECTED NONE DETECTED   Amphetamines NONE DETECTED NONE DETECTED   Tetrahydrocannabinol POSITIVE (A) NONE DETECTED   Barbiturates NONE DETECTED NONE DETECTED    Comment:        DRUG SCREEN FOR MEDICAL PURPOSES ONLY.  IF CONFIRMATION IS NEEDED FOR ANY PURPOSE, NOTIFY LAB WITHIN 5 DAYS.        LOWEST DETECTABLE LIMITS FOR URINE DRUG SCREEN Drug Class       Cutoff (ng/mL) Amphetamine      1000 Barbiturate      200 Benzodiazepine   737 Tricyclics       106 Opiates          300 Cocaine          300 THC              50   Urinalysis, Routine w reflex microscopic (not at [facility])     Status: Abnormal   Collection Time: 05/15/16 10:09 PM  Result Value Ref Range   Color,  Urine YELLOW YELLOW   APPearance CLEAR CLEAR   Specific Gravity, Urine 1.027 1.005 - 1.030   pH 5.5 5.0 - 8.0   Glucose, UA NEGATIVE NEGATIVE mg/dL   Hgb urine dipstick TRACE (A) NEGATIVE   Bilirubin Urine NEGATIVE NEGATIVE   Ketones, ur NEGATIVE NEGATIVE mg/dL   Protein, ur 30 (A) NEGATIVE mg/dL   Nitrite NEGATIVE NEGATIVE   Leukocytes, UA NEGATIVE NEGATIVE  Urine microscopic-add on     Status: Abnormal   Collection Time: 05/15/16 10:09 PM  Result Value Ref Range   Squamous Epithelial / LPF 0-5 (A) NONE SEEN   WBC, UA NONE SEEN 0 - 5 WBC/hpf   RBC / HPF 0-5 0 - 5 RBC/hpf   Bacteria, UA RARE (A) NONE SEEN  MRSA PCR Screening     Status: None   Collection Time: 05/16/16  2:23 AM  Result Value Ref Range   MRSA by PCR NEGATIVE NEGATIVE    Comment:        The GeneXpert MRSA Assay (FDA approved for NASAL specimens only), is one component of a comprehensive MRSA colonization surveillance program. It is not intended to diagnose MRSA infection nor to guide or monitor treatment for MRSA infections.   Lipid panel     Status: Abnormal   Collection Time: 05/16/16  4:19 AM  Result Value Ref Range  Cholesterol 172 0 - 200 mg/dL   Triglycerides 71 <150 mg/dL   HDL 35 (L) >40 mg/dL   Total CHOL/HDL Ratio 4.9 RATIO   VLDL 14 0 - 40 mg/dL   LDL Cholesterol 123 (H) 0 - 99 mg/dL    Comment:        Total Cholesterol/HDL:CHD Risk Coronary Heart Disease Risk Table                     Men   Women  1/2 Average Risk   3.4   3.3  Average Risk       5.0   4.4  2 X Average Risk   9.6   7.1  3 X Average Risk  23.4   11.0        Use the calculated Patient Ratio above and the CHD Risk Table to determine the patient's CHD Risk.        ATP III CLASSIFICATION (LDL):  <100     mg/dL   Optimal  100-129  mg/dL   Near or Above                    Optimal  130-159  mg/dL   Borderline  160-189  mg/dL   High  >190     mg/dL   Very High     Current Facility-Administered Medications  Medication  Dose Route Frequency Provider Last Rate Last Dose  . 0.9 %  sodium chloride infusion   Intravenous Continuous Wallie Char 75 mL/hr at 05/16/16 0630    . acetaminophen (TYLENOL) tablet 650 mg  650 mg Oral Q4H PRN Wallie Char   650 mg at 05/16/16 1287   Or  . acetaminophen (TYLENOL) suppository 650 mg  650 mg Rectal Q4H PRN Wallie Char      . labetalol (NORMODYNE,TRANDATE) injection 10 mg  10 mg Intravenous Q10 min PRN Wallie Char      . pantoprazole (PROTONIX) injection 40 mg  40 mg Intravenous QHS Wallie Char        Musculoskeletal: Strength & Muscle Tone: within normal limits Gait & Station: unable to stand Patient leans: N/A  Psychiatric Specialty Exam: Physical Exam as per history and physical   ROS result neurological symptoms at this current medication therapy No Fever-chills, No Headache, No changes with Vision or hearing, reports vertigo No problems swallowing food or Liquids, No Chest pain, Cough or Shortness of Breath, No Abdominal pain, No Nausea or Vommitting, Bowel movements are regular, No Blood in stool or Urine, No dysuria, No new skin rashes or bruises, No new joints pains-aches,  No new weakness, tingling, numbness in any extremity, No recent weight gain or loss, No polyuria, polydypsia or polyphagia,   A full 10 point Review of Systems was done, except as stated above, all other Review of Systems were negative.  Blood pressure 136/78, pulse 58, temperature 97.9 F (36.6 C), temperature source Oral, resp. rate 19, height 6' 3"  (1.905 m), weight 123.1 kg (271 lb 6.2 oz), SpO2 99 %.Body mass index is 33.92 kg/(m^2).  General Appearance: Guarded  Eye Contact:  Good  Speech:  Clear and Coherent  Volume:  Normal  Mood:  Anxious and Depressed  Affect:  Appropriate and Congruent  Thought Process:  Coherent and Goal Directed  Orientation:  Full (Time, Place, and Person)  Thought Content:  WDL and Rumination  Suicidal Thoughts:  No  Homicidal  Thoughts:  No  Memory:  Immediate;   Good Recent;  Fair  Judgement:  Intact  Insight:  Fair  Psychomotor Activity:  Normal  Concentration:  Concentration: Fair  Recall:  Learned of Knowledge:  Good  Language:  Good  Akathisia:  Negative  Handed:  Right  AIMS (if indicated):     Assets:  Communication Skills Desire for Improvement Financial Resources/Insurance Housing Intimacy Leisure Time Resilience Social Support Transportation  ADL's:  Intact  Cognition:  WNL  Sleep:        Treatment Plan Summary: Daily contact with patient to assess and evaluate symptoms and progress in treatment and Medication management  Patient denies safety concerns and suicidal thoughts intentions or plans.  Discontinue Latuda which caused extrapyramidal symptoms   Will restart Seroquel XL 300 mg daily which can be titrated slowly when tolerated.   Patient neurological symptoms of conversion disorder has been resolved within one day  Will restart trazodone 50 mg at bedtime for insomnia   Appreciate psychiatric consultation and follow up as clinically required Please contact [phone] or [phone] if needs further assistance    Disposition: Patient will be [facility] when medically stable for outpatient psychiatric services. Patient does not meet criteria for psychiatric inpatient admission. Supportive therapy provided about ongoing stressors.  Ambrose Finland, MD 05/16/2016 2:27 PM

## 2016-05-16 NOTE — Evaluation (Signed)
Speech Language Pathology Evaluation Patient Details Name: Benjamin Moon MRN: [id number] DOB: [birth year] Today's Date: 05/16/2016 Time: 1344-1400 SLP Time Calculation (min) (ACUTE ONLY): 16 min  Problem List:  Patient Active Problem List   Diagnosis Date Noted  . CVA (cerebral infarction) 05/16/2016  . Conversion disorder with weakness or paralysis, acute episode, with psychological stressor 05/16/2016   Past Medical History:  Past Medical History  Diagnosis Date  . Bipolar 1 disorder (HCC)   . TIA (transient ischemic attack)    Past Surgical History: History reviewed. No pertinent past surgical history. HPI:  50 y.o. male with a history of depression, bipolar, TIA, pt reported hx of conversion disorder admitted with acute onset of right sided weakness, aphasia and right facial droop. CT scan of his head showed no acute intracranial abnormality other than equivocal hypodensity involving insular region. NIH stroke score was 19, TPA which was administered. No CXR.    Assessment / Plan / Recommendation Clinical Impression  Pt administered the Cognistat standardized assessement. He reports difficulty with working memory "accumilated over the year." He reports using his smart phone, written information and girlfriend to recall information. He exhibited difficulty storing words for 5 word recall subtest and needed multiple repetitions although recalled 5/5 accuracy after 5 minute delay. Speech is slightly dysfluent at times not requring intervention. Pt appears to be utilizing compensatory strategies for memory and no further ST needed (pt in agreement).     SLP Assessment  Patient does not need any further Speech Lanaguage Pathology Services    Follow Up Recommendations  None    Frequency and Duration           SLP Evaluation Prior Functioning  Cognitive/Linguistic Baseline: Baseline deficits Baseline deficit details:  (pt reports poor memory) Vocation: Unemployed   Cognition  Overall Cognitive Status: Within Functional Limits for tasks assessed Arousal/Alertness: Awake/alert Orientation Level: Oriented X4 Attention: Sustained Sustained Attention: Appears intact Memory: Appears intact Awareness: Appears intact Problem Solving: Appears intact Safety/Judgment: Appears intact    Comprehension  Auditory Comprehension Overall Auditory Comprehension: Appears within functional limits for tasks assessed Visual Recognition/Discrimination Discrimination: Not tested Reading Comprehension Reading Status: Not tested    Expression Expression Primary Mode of Expression: Verbal Verbal Expression Overall Verbal Expression: Appears within functional limits for tasks assessed Level of Generative/Spontaneous Verbalization: Conversation Repetition: No impairment Pragmatics: No impairment Written Expression Dominant Hand: Right Written Expression: Not tested   Oral / Motor  Oral Motor/Sensory Function Overall Oral Motor/Sensory Function: Mild impairment Facial ROM: Reduced left Facial Symmetry: Abnormal symmetry left Lingual ROM: Within Functional Limits Lingual Symmetry: Within Functional Limits Motor Speech Overall Motor Speech:  (mild intermittent dysfluency) Intelligibility: Intelligible Motor Planning: Witnin functional limits   GO                    Royce MacadamiaLitaker, Tere Mcconaughey Willis 05/16/2016, 2:58 PM   Breck CoonsLisa Willis Tamyka Bezio M.Ed [employer] Pager [phone]

## 2016-05-16 NOTE — Evaluation (Signed)
Clinical/Bedside Swallow Evaluation Patient Details  Name: Benjamin Moon MRN: [id number] Date of Birth: [birth year]  Today's Date: 05/16/2016 Time: SLP Start Time (ACUTE ONLY): 1337 SLP Stop Time (ACUTE ONLY): 1343 SLP Time Calculation (min) (ACUTE ONLY): 6 min  Past Medical History:  Past Medical History  Diagnosis Date  . Bipolar 1 disorder (HCC)   . TIA (transient ischemic attack)    Past Surgical History: History reviewed. No pertinent past surgical history. HPI:  50 y.o. male with a history of depression, bipolar, TIA, pt reported hx of conversion disorder admitted with acute onset of right sided weakness, aphasia and right facial droop. CT scan of his head showed no acute intracranial abnormality other than equivocal hypodensity involving insular region. NIH stroke score was 19, TPA which was administered. No CXR.    Assessment / Plan / Recommendation Clinical Impression  Oral manipulation, mastication and transit WFL's. Swift swallow initiation without indications of airway compromise or residual. Pt denies difficulty. Recommend continue regular texture, thin liquids, straws allowed, pills with thin.     Aspiration Risk  Mild aspiration risk    Diet Recommendation Regular;Thin liquid   Liquid Administration via: Cup;Straw Medication Administration: Whole meds with liquid Supervision: Patient able to self feed;Intermittent supervision to cue for compensatory strategies Compensations: Slow rate;Small sips/bites Postural Changes: Seated upright at 90 degrees    Other  Recommendations Oral Care Recommendations: Oral care BID   Follow up Recommendations  None    Frequency and Duration            Prognosis        Swallow Study   General HPI: 50 y.o. male with a history of depression, bipolar, TIA, pt reported hx of conversion disorder admitted with acute onset of right sided weakness, aphasia and right facial droop. CT scan of his head showed no acute intracranial  abnormality other than equivocal hypodensity involving insular region. NIH stroke score was 19, TPA which was administered. No CXR.  Type of Study: Bedside Swallow Evaluation Previous Swallow Assessment:  (none) Diet Prior to this Study: Regular;Thin liquids Temperature Spikes Noted: No Respiratory Status: Room air History of Recent Intubation: No Behavior/Cognition: Alert;Cooperative;Pleasant mood Oral Cavity Assessment: Within Functional Limits Oral Care Completed by SLP: No Oral Cavity - Dentition: Adequate natural dentition Vision: Functional for self-feeding Self-Feeding Abilities: Able to feed self Patient Positioning: Upright in bed Baseline Vocal Quality: Normal Volitional Cough: Strong Volitional Swallow: Able to elicit    Oral/Motor/Sensory Function Overall Oral Motor/Sensory Function: Mild impairment Facial ROM: Reduced left Facial Symmetry: Abnormal symmetry left Lingual ROM: Within Functional Limits Lingual Symmetry: Within Functional Limits   Ice Chips Ice chips: Not tested   Thin Liquid Thin Liquid: Within functional limits Presentation: Cup;Straw    Nectar Thick Nectar Thick Liquid: Not tested   Honey Thick Honey Thick Liquid: Not tested   Puree Puree: Not tested   Solid    Solid: Within functional limits        Roque CashLitaker, Breck CoonsLisa Willis 05/16/2016,2:47 PM  Breck CoonsLisa Willis Paint RockLitaker M.Ed [employer] Pager [phone]

## 2016-05-17 DIAGNOSIS — F1210 Cannabis abuse, uncomplicated: Secondary | ICD-10-CM | POA: Diagnosis present

## 2016-05-17 DIAGNOSIS — R2990 Unspecified symptoms and signs involving the nervous system: Secondary | ICD-10-CM

## 2016-05-17 DIAGNOSIS — I10 Essential (primary) hypertension: Secondary | ICD-10-CM

## 2016-05-17 DIAGNOSIS — Z720 Tobacco use: Secondary | ICD-10-CM | POA: Diagnosis present

## 2016-05-17 DIAGNOSIS — N189 Chronic kidney disease, unspecified: Secondary | ICD-10-CM | POA: Diagnosis present

## 2016-05-17 DIAGNOSIS — E785 Hyperlipidemia, unspecified: Secondary | ICD-10-CM | POA: Diagnosis present

## 2016-05-17 DIAGNOSIS — G4700 Insomnia, unspecified: Secondary | ICD-10-CM | POA: Diagnosis present

## 2016-05-17 LAB — HEMOGLOBIN A1C
HEMOGLOBIN A1C: 5.1 % (ref 4.8–5.6)
Mean Plasma Glucose: 100 mg/dL

## 2016-05-17 MED ORDER — QUETIAPINE FUMARATE ER 300 MG PO TB24: 300.0000 mg | ORAL_TABLET | Freq: Every day | ORAL | Status: AC

## 2016-05-17 MED ORDER — ATORVASTATIN CALCIUM 20 MG PO TABS: 20.0000 mg | ORAL_TABLET | Freq: Every day | ORAL | Status: DC

## 2016-05-17 NOTE — Clinical Documentation Improvement (Signed)
Neurology  Can the diagnosis of CKD be further specified by type ? Thank you    CKD Stage I - GFR greater than or equal to 90  CKD Stage II - GFR 60-89  CKD Stage III - GFR 30-59  CKD Stage IV - GFR 15-29  CKD Stage V - GFR < 15  Other condition  Unable to clinically determine   Supporting Information:  CVA and HTN, h/o TIA  Treatment: IV NS @ 200 ml/hr, then 75 ml/hr    Please exercise your independent, professional judgment when responding. A specific answer is not anticipated or expected.   Thank You, Lavonda JumboLawanda J Keylee Shrestha [employer] [place] [phone]

## 2016-05-17 NOTE — Progress Notes (Signed)
Discharge orders received, pt for discharge home today [facility], IV D/C.  D/C instructions and Rx given with verbalized understanding.  Girlfriend at bedside to assist pt with discharge. Staff brought pt downstairs via wheelchair.

## 2016-05-17 NOTE — Consult Note (Signed)
[facility] Face-to-Face Psychiatry Consult Follow Up  Reason for Consult:  Conversion disorder with neurological deficits Referring Physician:  Dr. Erlinda Hong Patient Identification: Benjamin Moon MRN:  [id number] Principal Diagnosis: Conversion disorder with weakness or paralysis, acute episode, with psychological stressor Diagnosis:   Patient Active Problem List   Diagnosis Date Noted  . CVA (cerebral infarction) [I63.9] 05/16/2016  . Conversion disorder with weakness or paralysis, acute episode, with psychological stressor [F44.4] 05/16/2016  . Bipolar I disorder, most recent episode depressed ([place]) [F31.30] 05/16/2016    Total Time spent with patient: 30 minutes  Subjective:   [name] is a 50 y.o. male patient admitted with conversion disorder with neurological deficits.  HPI:  Benjamin Moon is an 50 y.o. Male, Seen, chart reviewed for psychiatric consultation and evaluation of bipolar depression, conversion disorder with weakness or paralysis, acute episode with the psychological stresses. Patient [facility] to the [facility] with the symptoms of stroke, acute onset of weakness involving the right side as well as some difficulty with the speech output and right facial droop and required TPA treatment. Patient reported he has been diagnosed with bipolar disorder and conversion disorder and recently his outpatient psychiatrist at [facility], [place] cross titrated Seroquel XR to [place] because of weight gain associated with Seroquel and also running out of the program which provides free medication. Patient reportedly started having extrapyramidal symptoms like motor twitches and also tongue trusting. Patient reported he cannot stick out his tongue and also difficult to talk. Patient did also has a multiple psychosocial stresses related to relationship with his girlfriend 6 months, biological father being in a hospital in [place] and has a plan of traveling, financial  difficulties and a middle of switching his medication which he believes caused conversion disorder. Patient reportedly has 3 different episodes of conversion disorders in the past while living in [place] area. Patient denies active suicidal/homicidal ideation, intention or plans and also requested to change his medication back to Seroquel which he was taking for the last 10 years and stable emotionally. Patient urine drug screen is positive for tetrahydrocannabinol.  Past Psychiatric History: Bipolar disorder and conversion disorder and also history of acute psychiatric hospitalization while living in [place] area in [place].  Interval history: Patient seen face-to-face for psychiatric consultation follow-up today. Patient girlfriend is at bedside. Patient reportedly compliant with his medication and has no reported emotional or behavioral problems. Patient does not have an extrapyramidal symptoms which was resolved with the time and stopping his medication Latuda. Patient is able to tolerate his medication and willing to follow up with outpatient medication management. Patient denies current suicidal/homicidal ideation, intention or plans. Patient has no evidence of psychosis. Patient girlfriend is supportive and willing to take him to the home and also follow up with outpatient medication management.  Risk to Self: Is patient at risk for suicide?: No Risk to Others:   Prior Inpatient Therapy:   Prior Outpatient Therapy:    Past Medical History:  Past Medical History  Diagnosis Date  . Bipolar 1 disorder ([place])   . TIA (transient ischemic attack)    History reviewed. No pertinent past surgical history. Family History: No family history on file. Family Psychiatric  History: Unknown  Social History:  History  Alcohol Use No     History  Drug Use  . Yes  . Special: Marijuana    Social History   Social History  . Marital Status: N/A    Spouse Name: N/A  . Number of  Children: N/A  .  Years of Education: N/A   Social History Main Topics  . Smoking status: Smoker, Current Status Unknown  . Smokeless tobacco: None  . Alcohol Use: No  . Drug Use: Yes    Special: Marijuana  . Sexual Activity: Not Asked   Other Topics Concern  . None   Social History Narrative  . None   Additional Social History:    Allergies:  No Known Allergies  Labs:  Results for orders placed or performed during the hospital encounter of 05/15/16 (from the past 48 hour(s))  Protime-INR     Status: Abnormal   Collection Time: 05/15/16  9:30 PM  Result Value Ref Range   Prothrombin Time 15.7 (H) 11.6 - 15.2 seconds   INR 1.24 0.00 - 1.49  APTT     Status: None   Collection Time: 05/15/16  9:30 PM  Result Value Ref Range   aPTT 32 24 - 37 seconds  CBC     Status: Abnormal   Collection Time: 05/15/16  9:30 PM  Result Value Ref Range   WBC 5.7 4.0 - 10.5 K/uL   RBC 4.84 4.22 - 5.81 MIL/uL   Hemoglobin 12.8 (L) 13.0 - 17.0 g/dL   HCT 38.2 (L) 39.0 - 52.0 %   MCV 78.9 78.0 - 100.0 fL   MCH 26.4 26.0 - 34.0 pg   MCHC 33.5 30.0 - 36.0 g/dL   RDW 14.9 11.5 - 15.5 %   Platelets 241 150 - 400 K/uL  Differential     Status: None   Collection Time: 05/15/16  9:30 PM  Result Value Ref Range   Neutrophils Relative % 71 %   Neutro Abs 4.0 1.7 - 7.7 K/uL   Lymphocytes Relative 24 %   Lymphs Abs 1.4 0.7 - 4.0 K/uL   Monocytes Relative 5 %   Monocytes Absolute 0.3 0.1 - 1.0 K/uL   Eosinophils Relative 0 %   Eosinophils Absolute 0.0 0.0 - 0.7 K/uL   Basophils Relative 0 %   Basophils Absolute 0.0 0.0 - 0.1 K/uL  Comprehensive metabolic panel     Status: Abnormal   Collection Time: 05/15/16  9:30 PM  Result Value Ref Range   Sodium 138 135 - 145 mmol/L   Potassium 3.9 3.5 - 5.1 mmol/L   Chloride 110 101 - 111 mmol/L   CO2 23 22 - 32 mmol/L   Glucose, Bld 127 (H) 65 - 99 mg/dL   BUN 23 (H) 6 - 20 mg/dL   Creatinine, Ser 1.48 (H) 0.61 - 1.24 mg/dL   Calcium 9.0 8.9 - 10.3 mg/dL   Total  Protein 6.3 (L) 6.5 - 8.1 g/dL   Albumin 4.1 3.5 - 5.0 g/dL   AST 62 (H) 15 - 41 U/L   ALT 33 17 - 63 U/L   Alkaline Phosphatase 67 38 - 126 U/L   Total Bilirubin 0.7 0.3 - 1.2 mg/dL   GFR calc non Af Amer 53 (L) >60 mL/min   GFR calc Af Amer >60 >60 mL/min    Comment: (NOTE) The eGFR has been calculated using the CKD EPI equation. This calculation has not been validated in all clinical situations. eGFR's persistently <60 mL/min signify possible Chronic Kidney Disease.    Anion gap 5 5 - 15  Ethanol     Status: None   Collection Time: 05/15/16  9:36 PM  Result Value Ref Range   Alcohol, Ethyl (B) <5 <5 mg/dL    Comment:  LOWEST DETECTABLE LIMIT FOR SERUM ALCOHOL IS 5 mg/dL FOR MEDICAL PURPOSES ONLY   I-stat troponin, ED (not at [facility], [facility])     Status: None   Collection Time: 05/15/16  9:44 PM  Result Value Ref Range   Troponin i, poc 0.01 0.00 - 0.08 ng/mL   Comment 3            Comment: Due to the release kinetics of cTnI, a negative result within the first hours of the onset of symptoms does not rule out myocardial infarction with certainty. If myocardial infarction is still suspected, repeat the test at appropriate intervals.   I-Stat Chem 8, ED  (not at [facility], [facility])     Status: Abnormal   Collection Time: 05/15/16  9:46 PM  Result Value Ref Range   Sodium 143 135 - 145 mmol/L   Potassium 4.8 3.5 - 5.1 mmol/L   Chloride 108 101 - 111 mmol/L   BUN 26 (H) 6 - 20 mg/dL   Creatinine, Ser 1.60 (H) 0.61 - 1.24 mg/dL   Glucose, Bld 113 (H) 65 - 99 mg/dL   Calcium, Ion 1.15 1.12 - 1.23 mmol/L   TCO2 22 0 - 100 mmol/L   Hemoglobin 13.3 13.0 - 17.0 g/dL   HCT 39.0 39.0 - 52.0 %  CBG monitoring, ED     Status: Abnormal   Collection Time: 05/15/16 10:03 PM  Result Value Ref Range   Glucose-Capillary 114 (H) 65 - 99 mg/dL  Urine rapid drug screen (hosp performed)not at [facility]     Status: Abnormal   Collection Time: 05/15/16 10:08 PM  Result Value Ref Range   Opiates  NONE DETECTED NONE DETECTED   Cocaine NONE DETECTED NONE DETECTED   Benzodiazepines NONE DETECTED NONE DETECTED   Amphetamines NONE DETECTED NONE DETECTED   Tetrahydrocannabinol POSITIVE (A) NONE DETECTED   Barbiturates NONE DETECTED NONE DETECTED    Comment:        DRUG SCREEN FOR MEDICAL PURPOSES ONLY.  IF CONFIRMATION IS NEEDED FOR ANY PURPOSE, NOTIFY LAB WITHIN 5 DAYS.        LOWEST DETECTABLE LIMITS FOR URINE DRUG SCREEN Drug Class       Cutoff (ng/mL) Amphetamine      1000 Barbiturate      200 Benzodiazepine   322 Tricyclics       025 Opiates          300 Cocaine          300 THC              50   Urinalysis, Routine w reflex microscopic (not at [facility])     Status: Abnormal   Collection Time: 05/15/16 10:09 PM  Result Value Ref Range   Color, Urine YELLOW YELLOW   APPearance CLEAR CLEAR   Specific Gravity, Urine 1.027 1.005 - 1.030   pH 5.5 5.0 - 8.0   Glucose, UA NEGATIVE NEGATIVE mg/dL   Hgb urine dipstick TRACE (A) NEGATIVE   Bilirubin Urine NEGATIVE NEGATIVE   Ketones, ur NEGATIVE NEGATIVE mg/dL   Protein, ur 30 (A) NEGATIVE mg/dL   Nitrite NEGATIVE NEGATIVE   Leukocytes, UA NEGATIVE NEGATIVE  Urine microscopic-add on     Status: Abnormal   Collection Time: 05/15/16 10:09 PM  Result Value Ref Range   Squamous Epithelial / LPF 0-5 (A) NONE SEEN   WBC, UA NONE SEEN 0 - 5 WBC/hpf   RBC / HPF 0-5 0 - 5 RBC/hpf   Bacteria, UA RARE (A)  NONE SEEN  MRSA PCR Screening     Status: None   Collection Time: 05/16/16  2:23 AM  Result Value Ref Range   MRSA by PCR NEGATIVE NEGATIVE    Comment:        The GeneXpert MRSA Assay (FDA approved for NASAL specimens only), is one component of a comprehensive MRSA colonization surveillance program. It is not intended to diagnose MRSA infection nor to guide or monitor treatment for MRSA infections.   Hemoglobin A1c     Status: None   Collection Time: 05/16/16  4:19 AM  Result Value Ref Range   Hgb A1c MFr Bld 5.1 4.8 -  5.6 %    Comment: (NOTE)         Pre-diabetes: 5.7 - 6.4         Diabetes: >6.4         Glycemic control for adults with diabetes: <7.0    Mean Plasma Glucose 100 mg/dL    Comment: (NOTE) Performed At: [facility] [place], [place] [id number] Lindon Romp MD [id number]   Lipid panel     Status: Abnormal   Collection Time: 05/16/16  4:19 AM  Result Value Ref Range   Cholesterol 172 0 - 200 mg/dL   Triglycerides 71 <150 mg/dL   HDL 35 (L) >40 mg/dL   Total CHOL/HDL Ratio 4.9 RATIO   VLDL 14 0 - 40 mg/dL   LDL Cholesterol 123 (H) 0 - 99 mg/dL    Comment:        Total Cholesterol/HDL:CHD Risk Coronary Heart Disease Risk Table                     Men   Women  1/2 Average Risk   3.4   3.3  Average Risk       5.0   4.4  2 X Average Risk   9.6   7.1  3 X Average Risk  23.4   11.0        Use the calculated Patient Ratio above and the CHD Risk Table to determine the patient's CHD Risk.        ATP III CLASSIFICATION (LDL):  <100     mg/dL   Optimal  100-129  mg/dL   Near or Above                    Optimal  130-159  mg/dL   Borderline  160-189  mg/dL   High  >190     mg/dL   Very High     Current Facility-Administered Medications  Medication Dose Route Frequency Provider Last Rate Last Dose  . 0.9 %  sodium chloride infusion   Intravenous Continuous Wallie Char   Stopped at 05/17/16 0800  . acetaminophen (TYLENOL) tablet 650 mg  650 mg Oral Q4H PRN Wallie Char   650 mg at 05/16/16 9702   Or  . acetaminophen (TYLENOL) suppository 650 mg  650 mg Rectal Q4H PRN Wallie Char      . aspirin tablet 325 mg  325 mg Oral Daily Wallie Char   325 mg at 05/16/16 2233  . atorvastatin (LIPITOR) tablet 20 mg  20 mg Oral q1800 Rosalin Hawking, MD   20 mg at 05/16/16 1916  . labetalol (NORMODYNE,TRANDATE) injection 10 mg  10 mg Intravenous Q10 min PRN Wallie Char      . pantoprazole (PROTONIX) injection 40 mg  40 mg Intravenous QHS Wallie Char  40 mg at 05/16/16 2233  . QUEtiapine (SEROQUEL XR) 24 hr tablet 300 mg  300 mg Oral QHS Ambrose Finland, MD   300 mg at 05/16/16 2233  . traZODone (DESYREL) tablet 50 mg  50 mg Oral QHS Ambrose Finland, MD   50 mg at 05/16/16 2233    Musculoskeletal: Strength & Muscle Tone: within normal limits Gait & Station: unable to stand Patient leans: N/A  Psychiatric Specialty Exam: Physical Exam  ROS  Blood pressure 117/67, pulse 57, temperature 97.8 F (36.6 C), temperature source Oral, resp. rate 13, height _0  (1.905 m), weight 123.1 kg (271 lb 6.2 oz), SpO2 99 %.Body mass index is 33.92 kg/(m^2).  General Appearance: Guarded  Eye Contact:  Good  Speech:  Clear and Coherent  Volume:  Normal  Mood:  Anxious and Depressed  Affect:  Appropriate and Congruent  Thought Process:  Coherent and Goal Directed  Orientation:  Full (Time, Place, and Person)  Thought Content:  WDL and Rumination  Suicidal Thoughts:  No  Homicidal Thoughts:  No  Memory:  Immediate;   Good Recent;   Fair  Judgement:  Intact  Insight:  Fair  Psychomotor Activity:  Normal  Concentration:  Concentration: Fair  Recall:  Espy of Knowledge:  Good  Language:  Good  Akathisia:  Negative  Handed:  Right  AIMS (if indicated):     Assets:  Communication Skills Desire for Improvement Financial Resources/Insurance Housing Intimacy Leisure Time Resilience Social Support Transportation  ADL's:  Intact  Cognition:  WNL  Sleep:        Treatment Plan Summary: Patient has been slowly improving his symptoms of neurological deficits and has been stable without significant emotional or behavioral problems. Patient reportedly slept well and feeling more relaxed today.  Patient denies safety concerns and suicidal thoughts intentions or plans.  Discontinue Latuda which caused extrapyramidal symptoms   Continue Seroquel XL 300 mg daily which can be titrated slowly when tolerated.   Continue  trazodone 50 mg at bedtime for insomnia   Appreciate psychiatric consultation and  we sign off today as patient will be discharged home with family Please contact [phone] or [phone] if needs further assistance    Disposition: [facility] will be referred [facility] when medically stable for outpatient psychiatric services. Patient does not meet criteria for psychiatric inpatient admission. Supportive therapy provided about ongoing stressors.  Ambrose Finland, MD 05/17/2016 9:33 AM

## 2016-05-17 NOTE — Discharge Summary (Signed)
Stroke Discharge Summary  Patient ID: Benjamin Moon    l   MRN: [id number]      DOB: 1966/08/07  Date of Admission: 05/15/2016 Date of Discharge: 05/17/2016  Attending Physician:  Marvel PlanJindong Boone Gear, MD, Stroke MD Consulting Physician(s):   Leata MouseJanardhana Jonnalagadda, MD  (psychiatry) Patient's PCP:  No primary care provider on file.  DISCHARGE DIAGNOSIS:  Principal Problem:   Conversion disorder with weakness or paralysis, acute episode, with psychological stressor Active Problems:   Stroke-like symptoms s/p IV tPA   Bipolar I disorder, most recent episode depressed (HCC)   Essential hypertension   Hyperlipidemia LDL goal <100   Tobacco use disorder   Mild tetrahydrocannabinol (THC) abuse   Chronic kidney disease stage II   Insomnia  BMI: Body mass index is 33.92 kg/(m^2).  Past Medical History  Diagnosis Date  . Bipolar 1 disorder (HCC)   . TIA (transient ischemic attack)    History reviewed. No pertinent past surgical history.    Medication List    STOP taking these medications        lurasidone 80 MG Tabs tablet  Commonly known as:  LATUDA      TAKE these medications        amLODipine 5 MG tablet  Commonly known as:  NORVASC  Take 5 mg by mouth daily.     aspirin 81 MG chewable tablet  Chew 81 mg by mouth daily.     atorvastatin 20 MG tablet  Commonly known as:  LIPITOR  Take 1 tablet (20 mg total) by mouth daily at 6 PM.     lamoTRIgine 100 MG tablet  Commonly known as:  LAMICTAL  Take 100 mg by mouth daily.     omeprazole 20 MG tablet  Commonly known as:  PRILOSEC OTC  Take 20 mg by mouth daily.     QUEtiapine 300 MG 24 hr tablet  Commonly known as:  SEROQUEL XR  Take 1 tablet (300 mg total) by mouth at bedtime.     traZODone 50 MG tablet  Commonly known as:  DESYREL  Take 50-100 mg by mouth at bedtime as needed for sleep. 1 to 2 tablets at bedtime as needed        LABORATORY STUDIES CBC    Component Value Date/Time   WBC 5.7 05/15/2016 2130   RBC 4.84 05/15/2016 2130   HGB 13.3 05/15/2016 2146   HCT 39.0 05/15/2016 2146   PLT 241 05/15/2016 2130   MCV 78.9 05/15/2016 2130   MCH 26.4 05/15/2016 2130   MCHC 33.5 05/15/2016 2130   RDW 14.9 05/15/2016 2130   LYMPHSABS 1.4 05/15/2016 2130   MONOABS 0.3 05/15/2016 2130   EOSABS 0.0 05/15/2016 2130   BASOSABS 0.0 05/15/2016 2130   CMP    Component Value Date/Time   NA 143 05/15/2016 2146   K 4.8 05/15/2016 2146   CL 108 05/15/2016 2146   CO2 23 05/15/2016 2130   GLUCOSE 113* 05/15/2016 2146   BUN 26* 05/15/2016 2146   CREATININE 1.60* 05/15/2016 2146   CALCIUM 9.0 05/15/2016 2130   PROT 6.3* 05/15/2016 2130   ALBUMIN 4.1 05/15/2016 2130   AST 62* 05/15/2016 2130   ALT 33 05/15/2016 2130   ALKPHOS 67 05/15/2016 2130   BILITOT 0.7 05/15/2016 2130   GFRNONAA 53* 05/15/2016 2130   GFRAA >60 05/15/2016 2130   COAGS Lab Results  Component Value Date   INR 1.24 05/15/2016   Lipid Panel    Component Value Date/Time  CHOL 172 05/16/2016 0419   TRIG 71 05/16/2016 0419   HDL 35* 05/16/2016 0419   CHOLHDL 4.9 05/16/2016 0419   VLDL 14 05/16/2016 0419   LDLCALC 123* 05/16/2016 0419   HgbA1C  Lab Results  Component Value Date   HGBA1C 5.1 05/16/2016   Urinalysis    Component Value Date/Time   COLORURINE YELLOW 05/15/2016 2209   APPEARANCEUR CLEAR 05/15/2016 2209   LABSPEC 1.027 05/15/2016 2209   PHURINE 5.5 05/15/2016 2209   GLUCOSEU NEGATIVE 05/15/2016 2209   HGBUR TRACE* 05/15/2016 2209   BILIRUBINUR NEGATIVE 05/15/2016 2209   KETONESUR NEGATIVE 05/15/2016 2209   PROTEINUR 30* 05/15/2016 2209   NITRITE NEGATIVE 05/15/2016 2209   LEUKOCYTESUR NEGATIVE 05/15/2016 2209   Urine Drug Screen     Component Value Date/Time   LABOPIA NONE DETECTED 05/15/2016 2208   COCAINSCRNUR NONE DETECTED 05/15/2016 2208   LABBENZ NONE DETECTED 05/15/2016 2208   AMPHETMU NONE DETECTED 05/15/2016 2208   THCU POSITIVE* 05/15/2016 2208   LABBARB NONE DETECTED 05/15/2016  2208    Alcohol Level    Component Value Date/Time   ETH <5 05/15/2016 2136     SIGNIFICANT DIAGNOSTIC STUDIES Ct Head Wo Contrast 5/24/2017No definite acute insult. Question slight loss of cortical ribbon in the insular region on the left. Possible aspects 9.   CTA NECK 05/16/2016 Negative.   CTA HEAD 05/16/2016 No emergent large vessel occlusion or significant stenosis. Mild intracranial atherosclerosis.   Benjamin Brain Wo Contrast 05/16/2016   No acute intracranial process. Mild white matter changes most commonly seen with chronic small vessel ischemic disease.    2D echocardiogram  - Left ventricle: The cavity size was normal. Wall thickness was normal. Systolic function was normal. The estimated ejection fraction was in the range of 60% to 65%. Wall motion was normal; there were no regional wall motion abnormalities. - Right atrium: The atrium was mildly dilated.     HISTORY OF PRESENT ILLNESS Benjamin Moon is an 50 y.o. male with a history of depression and equivocal history of stroke and TIA, brought to the emergency room and code stroke status following acute onset of weakness involving his right side as well as difficulty with speech output and right facial droop. He was last known well at 8:42 PM 05/15/2016. He's been taking aspirin daily. He reportedly had a stroke in 2014 and was treated with TPA with a very good outcome. CT scan of his head showed no acute intracranial abnormality other than equivocal hypodensity involving insular region. NIH stroke score was 19. Patient was deemed a candidate for TPA which was administered. He began to show improvement about 30-40 minutes after TPA was started. He he had a remarkable recovery with only mild sensory changes as residual. CT angiogram showed no signs of proximal occlusion or significant large vessel stenosis. He was admitted to the neuro ICU for further evaluation and treatment.    HOSPITAL COURSE Benjamin. Mavrik Moon is a 49 y.o.  male with history of bipolar d/o and ?stroke/TIA s/p tPA presenting with speech difficulty, facial droop and right-sided weakness. He received IV t-PA 05/15/2016 at 2204. Tolerated without difficulty. MRI negative. Presume stroke felt to be likely conversion d/i in this bipolar pt. pysch consulted. Changed latuda to seroquel due to risk of extrapyramidal symptoms. Patient discharged home.  Likely conversion disorder - hx of conversion disorder 2015 and severe stroke symptoms cleared over night  Resultant Deficits resolved  CTA H&N Unremarkable   MRI no acute stroke  2D  Echo unremarkable  LDL 123  HgbA1c 5.1  aspirin 81 mg daily prior to admission, now on aspirin 325 mg post tPA. Ok to resume home dose of aspirin at time of discharge.   Ongoing aggressive OP primary stroke risk factor management  Therapy recommendations:no therapy needs   Disposition: home  hypertension   On norvasc at home  Resume at discharge  Hyperlipidemia  Home meds: No statin  LDL 123, goal < 100  Added lipitor 20mg   Continue statin at discharge  Tobacco abuse  Current smoker  Smoking cessation counseling provided  Pt is willing to quit  Other Stroke Risk Factors  THC use, UDS positive this admission. Patient advised to stop.  Obesity, Body mass index is 33.92 kg/(m^2).  Other Active Problems  CKD stage II, Cr 1.6  Bipolar d/o - psych consulted. D/d'c latuda and restarted seroquel 300 daily, titrate slowly when tolerated.   Insomnia - psych - Restart trazadone 50 at hs for insomnia  GERD on prilosec   DISCHARGE EXAM Blood pressure 122/68, pulse 68, temperature 98.5 F (36.9 C), temperature source Oral, resp. rate 16, height 6\' 3"  (1.905 m), weight 123.1 kg (271 lb 6.2 oz), SpO2 99 %. General - Well nourished, well developed, in no apparent distress.  Ophthalmologic - Sharp disc margins OU.   Cardiovascular - Regular rate and rhythm with no murmur.  Mental Status  -  Level of arousal and orientation to time, place, and person were intact. Language including expression, naming, repetition, comprehension was assessed and found intact. Fund of Knowledge was assessed and was intact.  Cranial Nerves II - XII - II - Visual field intact OU. III, IV, VI - Extraocular movements intact. V - Facial sensation intact bilaterally. VII - Facial movement intact bilaterally. VIII - Hearing & vestibular intact bilaterally. X - Palate elevates symmetrically. XI - Chin turning & shoulder shrug intact bilaterally. XII - Tongue protrusion intact.  Motor Strength - The patient's strength was normal in all extremities and pronator drift was absent. Bulk was normal and fasciculations were absent.  Motor Tone - Muscle tone was assessed at the neck and appendages and was normal.  Reflexes - The patient's reflexes were 1+ in all extremities and he had no pathological reflexes.  Sensory - Light touch, temperature/pinprick were assessed and were symmetrical.   Coordination - The patient had normal movements in the hands and feet with no ataxia or dysmetria. Tremor was absent.  Gait and Station - not tested as TTE tech is performing test.  Discharge Diet   Diet Heart Room service appropriate?: Yes; Fluid consistency:: Thin liquids  DISCHARGE PLAN  Disposition:  Return home   Follow-up  primary care provider in 2 weeks. Get one if you do not have.  Referred to [facility] by psychiatry.   No neuro follow up indicated  40 minutes were spent preparing discharge.  Marvel Plan, MD PhD Stroke Neurology 05/17/2016 9:25 PM

## 2016-05-17 NOTE — Evaluation (Signed)
Physical Therapy Evaluation Patient Details Name: Benjamin Moon Benjamin Moon MRN: [id number] DOB: 1966/07/06 Today's Date: 05/17/2016   History of Present Illness  50 y.o. male with a history of depression, bipolar, TIA, pt reported hx of conversion disorder admitted with acute onset of right sided weakness, aphasia and right facial droop. CT scan of his head showed no acute intracranial abnormality other than equivocal hypodensity involving insular region. NIH stroke score was 19, TPA which was administered.  Clinical Impression  Patient seen for mobility assessment. Patient's symptoms appear to have resolved. No deficits noted, [facility] tasks. No further acute PT needs. Will sign off.     Follow Up Recommendations No PT follow up    Equipment Recommendations  None recommended by PT    Recommendations for Other Services       Precautions / Restrictions        Mobility  Bed Mobility Overal bed mobility: Independent                Transfers Overall transfer level: Independent                  Ambulation/Gait Ambulation/Gait assistance: Independent Ambulation Distance (Feet): 340 Feet Assistive device: None Gait Pattern/deviations: WFL(Within Functional Limits)   Gait velocity interpretation: at or above normal speed for age/gender    Stairs Stairs: Yes Stairs assistance: Modified independent (Device/Increase time) Stair Management: Forwards Number of Stairs: 5    Wheelchair Mobility    Modified Rankin (Stroke Patients Only)       Balance Overall balance assessment: No apparent balance deficits (not formally assessed)                               Standardized Balance Assessment Standardized Balance Assessment : Dynamic Gait Index   Dynamic Gait Index Level Surface: Normal Change in Gait Speed: Normal Gait with Horizontal Head Turns: Normal Gait with Vertical Head Turns: Normal Gait and Pivot Turn: Mild Impairment Step Over  Obstacle: Normal Step Around Obstacles: Normal Steps: Mild Impairment Total Score: 22       Pertinent Vitals/Pain Pain Assessment: No/denies pain    Home Living Family/patient expects to be discharged to:: Private residence Living Arrangements: Spouse/significant other Available Help at Discharge: Family Type of Home: Apartment Home Access: Stairs to enter Entrance Stairs-Rails: Can reach both Entrance Stairs-Number of Steps: 4 Home Layout: One level Home Equipment: None      Prior Function Level of Independence: Independent               Hand Dominance   Dominant Hand: Right    Extremity/Trunk Assessment   Upper Extremity Assessment: Overall WFL for tasks assessed           Lower Extremity Assessment: Overall WFL for tasks assessed         Communication   Communication: No difficulties  Cognition Arousal/Alertness: Awake/alert Behavior During Therapy: WFL for tasks assessed/performed Overall Cognitive Status: Within Functional Limits for tasks assessed                      General Comments      Exercises        Assessment/Plan    PT Assessment Patent does not need any further PT services  PT Diagnosis Difficulty walking   PT Problem List    PT Treatment Interventions     PT Goals (Current goals can be found in the Care Plan  section) Acute Rehab PT Goals PT Goal Formulation: All assessment and education complete, DC therapy    Frequency     Barriers to discharge        Co-evaluation               End of Session Equipment Utilized During Treatment: Gait belt Activity Tolerance: Patient tolerated treatment well Patient left: in bed;with call bell/phone within reach Nurse Communication: Mobility status    Functional Assessment Tool Used: dgi Functional Limitation: Mobility: Walking and moving around Mobility: Walking and Moving Around Current Status [phone]): 0 percent impaired, limited or restricted Mobility:  Walking and Moving Around Goal Status [phone]): 0 percent impaired, limited or restricted Mobility: Walking and Moving Around Discharge Status [phone]): 0 percent impaired, limited or restricted    Time: 0926-0948 PT Time Calculation (min) (ACUTE ONLY): 22 min   Charges:   PT Evaluation $PT Eval Low Complexity: 1 Procedure     PT G Codes:   PT G-Codes **NOT FOR INPATIENT CLASS** Functional Assessment Tool Used: dgi Functional Limitation: Mobility: Walking and moving around Mobility: Walking and Moving Around Current Status (W2956): 0 percent impaired, limited or restricted Mobility: Walking and Moving Around Goal Status (O1308): 0 percent impaired, limited or restricted Mobility: Walking and Moving Around Discharge Status [phone]): 0 percent impaired, limited or restricted    Fabio Asa 05/17/2016, 9:53 AM  Charlotte Crumb, PT DPT  [phone]

## 2016-05-31 NOTE — Progress Notes (Signed)
Late entry for missed Modified Rankin Score. Based on review of medical record.   05/17/16 1136  Modified Rankin (Stroke Patients Only)  Pre-Morbid Rankin Score 0  Modified Rankin 0  GardnerMark Lorrane Mccay, [place]PT  [phone] 05/31/2016

## 2016-08-29 ENCOUNTER — Other Ambulatory Visit: Payer: Self-pay

## 2016-08-29 NOTE — Patient Outreach (Signed)
[facility] Care Management  08/29/2016  [place] Benjamin Moon 08-12-1966 [id number]   Outreach attempt to complete Modified Rankin Survey; non-working number on file; survey score is "7" due to unable to contact. Sharon SellerMichelle W. Benjamin Moon, MHA [facility] [phone]

## 2016-09-16 ENCOUNTER — Encounter (HOSPITAL_COMMUNITY): Payer: Self-pay | Admitting: Emergency Medicine

## 2016-09-16 ENCOUNTER — Emergency Department (HOSPITAL_COMMUNITY): Payer: Self-pay

## 2016-09-16 ENCOUNTER — Observation Stay (HOSPITAL_COMMUNITY)
Admission: EM | Admit: 2016-09-16 | Discharge: 2016-09-17 | Disposition: A | Discharge status: Home / Self Care | Payer: Self-pay | Attending: Internal Medicine | Admitting: Internal Medicine

## 2016-09-16 DIAGNOSIS — Z79899 Other long term (current) drug therapy: Secondary | ICD-10-CM | POA: Insufficient documentation

## 2016-09-16 DIAGNOSIS — R079 Chest pain, unspecified: Secondary | ICD-10-CM | POA: Diagnosis present

## 2016-09-16 DIAGNOSIS — N182 Chronic kidney disease, stage 2 (mild): Secondary | ICD-10-CM | POA: Insufficient documentation

## 2016-09-16 DIAGNOSIS — I10 Essential (primary) hypertension: Secondary | ICD-10-CM

## 2016-09-16 DIAGNOSIS — F3130 Bipolar disorder, current episode depressed, mild or moderate severity, unspecified: Secondary | ICD-10-CM

## 2016-09-16 DIAGNOSIS — R0789 Other chest pain: Principal | ICD-10-CM | POA: Insufficient documentation

## 2016-09-16 DIAGNOSIS — R072 Precordial pain: Secondary | ICD-10-CM

## 2016-09-16 DIAGNOSIS — E785 Hyperlipidemia, unspecified: Secondary | ICD-10-CM

## 2016-09-16 DIAGNOSIS — N189 Chronic kidney disease, unspecified: Secondary | ICD-10-CM | POA: Diagnosis present

## 2016-09-16 DIAGNOSIS — I129 Hypertensive chronic kidney disease with stage 1 through stage 4 chronic kidney disease, or unspecified chronic kidney disease: Secondary | ICD-10-CM | POA: Insufficient documentation

## 2016-09-16 DIAGNOSIS — Z720 Tobacco use: Secondary | ICD-10-CM

## 2016-09-16 DIAGNOSIS — F17200 Nicotine dependence, unspecified, uncomplicated: Secondary | ICD-10-CM | POA: Insufficient documentation

## 2016-09-16 DIAGNOSIS — F444 Conversion disorder with motor symptom or deficit: Secondary | ICD-10-CM

## 2016-09-16 DIAGNOSIS — Z8673 Personal history of transient ischemic attack (TIA), and cerebral infarction without residual deficits: Secondary | ICD-10-CM | POA: Insufficient documentation

## 2016-09-16 DIAGNOSIS — Z7982 Long term (current) use of aspirin: Secondary | ICD-10-CM | POA: Insufficient documentation

## 2016-09-16 HISTORY — DX: Essential (primary) hypertension: I10

## 2016-09-16 HISTORY — DX: Unspecified convulsions: R56.9

## 2016-09-16 LAB — CBC
HCT: 38 % — ABNORMAL LOW (ref 39.0–52.0)
HEMOGLOBIN: 12.6 g/dL — AB (ref 13.0–17.0)
MCH: 26.4 pg (ref 26.0–34.0)
MCHC: 33.2 g/dL (ref 30.0–36.0)
MCV: 79.5 fL (ref 78.0–100.0)
PLATELETS: 221 10*3/uL (ref 150–400)
RBC: 4.78 MIL/uL (ref 4.22–5.81)
RDW: 15.4 % (ref 11.5–15.5)
WBC: 4.6 10*3/uL (ref 4.0–10.5)

## 2016-09-16 LAB — TROPONIN I

## 2016-09-16 LAB — LIPID PANEL
CHOL/HDL RATIO: 4.4 ratio
Cholesterol: 161 mg/dL (ref 0–200)
HDL: 37 mg/dL — ABNORMAL LOW (ref >40)
LDL Cholesterol: 96 mg/dL (ref 0–99)
Triglycerides: 140 mg/dL (ref <150)
VLDL: 28 mg/dL (ref 0–40)

## 2016-09-16 LAB — RAPID URINE DRUG SCREEN, HOSP PERFORMED
Amphetamines: NOT DETECTED
BARBITURATES: NOT DETECTED
Benzodiazepines: NOT DETECTED
COCAINE: NOT DETECTED
OPIATES: NOT DETECTED
TETRAHYDROCANNABINOL: POSITIVE — AB

## 2016-09-16 LAB — BASIC METABOLIC PANEL
ANION GAP: 7 (ref 5–15)
BUN: 15 mg/dL (ref 6–20)
CALCIUM: 9.2 mg/dL (ref 8.9–10.3)
CHLORIDE: 111 mmol/L (ref 101–111)
CO2: 24 mmol/L (ref 22–32)
Creatinine, Ser: 1.33 mg/dL — ABNORMAL HIGH (ref 0.61–1.24)
GFR calc Af Amer: >60 mL/min (ref >60)
GLUCOSE: 84 mg/dL (ref 65–99)
POTASSIUM: 4.4 mmol/L (ref 3.5–5.1)
Sodium: 142 mmol/L (ref 135–145)

## 2016-09-16 LAB — ETHANOL: Alcohol, Ethyl (B): <5 mg/dL (ref <5)

## 2016-09-16 LAB — I-STAT TROPONIN, ED: TROPONIN I, POC: 0 ng/mL (ref 0.00–0.08)

## 2016-09-16 MED ORDER — NITROGLYCERIN IN D5W 200-5 MCG/ML-% IV SOLN
0.0000 ug/min | Freq: Once | INTRAVENOUS | Status: AC
  Administered 2016-09-16: 5 ug/min via INTRAVENOUS
  Filled 2016-09-16: qty 250

## 2016-09-16 MED ORDER — QUETIAPINE FUMARATE ER 300 MG PO TB24
600.0000 mg | ORAL_TABLET | Freq: Every day | ORAL | Status: DC
  Administered 2016-09-16: 600 mg via ORAL
  Filled 2016-09-16: qty 2

## 2016-09-16 MED ORDER — ASPIRIN EC 325 MG PO TBEC
325.0000 mg | DELAYED_RELEASE_TABLET | Freq: Every day | ORAL | Status: DC
  Administered 2016-09-17: 325 mg via ORAL
  Filled 2016-09-16: qty 1

## 2016-09-16 MED ORDER — NITROGLYCERIN 0.4 MG SL SUBL
0.4000 mg | SUBLINGUAL_TABLET | SUBLINGUAL | Status: DC | PRN
  Administered 2016-09-16 (×2): 0.4 mg via SUBLINGUAL
  Filled 2016-09-16: qty 1

## 2016-09-16 MED ORDER — HEPARIN BOLUS VIA INFUSION
4000.0000 [IU] | Freq: Once | INTRAVENOUS | Status: AC
  Administered 2016-09-16: 4000 [IU] via INTRAVENOUS
  Filled 2016-09-16: qty 4000

## 2016-09-16 MED ORDER — OMEPRAZOLE MAGNESIUM 20 MG PO TBEC
20.0000 mg | DELAYED_RELEASE_TABLET | Freq: Every day | ORAL | Status: DC | PRN
Start: 2016-09-16 — End: 2016-09-16

## 2016-09-16 MED ORDER — LORAZEPAM 2 MG/ML IJ SOLN
1.0000 mg | INTRAMUSCULAR | Status: DC | PRN
  Administered 2016-09-16: 1 mg via INTRAVENOUS
  Filled 2016-09-16 (×2): qty 1

## 2016-09-16 MED ORDER — ONDANSETRON HCL 4 MG/2ML IJ SOLN: 4.0000 mg | Freq: Four times a day (QID) | INTRAMUSCULAR | Status: DC | PRN

## 2016-09-16 MED ORDER — LAMOTRIGINE 100 MG PO TABS
100.0000 mg | ORAL_TABLET | Freq: Every day | ORAL | Status: DC
  Administered 2016-09-16: 100 mg via ORAL
  Filled 2016-09-16: qty 1

## 2016-09-16 MED ORDER — KETOROLAC TROMETHAMINE 30 MG/ML IJ SOLN: 30.0000 mg | Freq: Once | INTRAMUSCULAR | Status: DC

## 2016-09-16 MED ORDER — GI COCKTAIL ~~LOC~~: 30.0000 mL | Freq: Four times a day (QID) | ORAL | Status: DC | PRN

## 2016-09-16 MED ORDER — AMLODIPINE BESYLATE 5 MG PO TABS
5.0000 mg | ORAL_TABLET | Freq: Every day | ORAL | Status: DC
  Administered 2016-09-16: 5 mg via ORAL
  Filled 2016-09-16: qty 1

## 2016-09-16 MED ORDER — ASPIRIN 81 MG PO CHEW: 81.0000 mg | CHEWABLE_TABLET | Freq: Every day | ORAL | Status: DC

## 2016-09-16 MED ORDER — HEPARIN (PORCINE) IN NACL 100-0.45 UNIT/ML-% IJ SOLN
1550.0000 [IU]/h | INTRAMUSCULAR | Status: DC
  Administered 2016-09-16: 1550 [IU]/h via INTRAVENOUS
  Filled 2016-09-16: qty 250

## 2016-09-16 MED ORDER — ACETAMINOPHEN 325 MG PO TABS: 650.0000 mg | ORAL_TABLET | ORAL | Status: DC | PRN

## 2016-09-16 MED ORDER — PANTOPRAZOLE SODIUM 40 MG PO TBEC
40.0000 mg | DELAYED_RELEASE_TABLET | Freq: Every day | ORAL | Status: DC
  Administered 2016-09-16 – 2016-09-17 (×2): 40 mg via ORAL
  Filled 2016-09-16 (×2): qty 1

## 2016-09-16 MED ORDER — SODIUM CHLORIDE 0.9 % IV SOLN
INTRAVENOUS | Status: AC
  Administered 2016-09-16: 50 mL/h via INTRAVENOUS

## 2016-09-16 NOTE — ED Notes (Signed)
Pt st's no change with NTG gtt

## 2016-09-16 NOTE — ED Triage Notes (Signed)
Pt arrives via EMS from workplace with substernal CP that began approx 1 hour ago while at work. Pt also c/o SOB. Lung sounds CTA. 12L unremarkable. Pt received 324mg  ASA, 1 SL nitro with little change in pain. Pt nonverbal at present. VSS/  CBG 91. 22g Right hand

## 2016-09-16 NOTE — ED Notes (Signed)
Pt made aware of bed assignment 

## 2016-09-16 NOTE — ED Notes (Signed)
Pt having apenic periods and not answering to verbal commands.  On physical stimulation pt responding and taking in a deep breath.  Notified Dr Fayrene FearingJames.  Provider at bedside now.

## 2016-09-16 NOTE — H&P (Signed)
History and Physical    [place] Benjamin Moon AYT:[id number] DOB: [birth year] DOA: 09/16/2016  PCP: No PCP Per Patient Patient coming from: workplace/home  Chief Complaint: chest pain  HPI: Benjamin Moon is a pleasant  50 y.o. male with medical history significant for bipolar disorder, chronic kidney disease stage II, conversion disorder, hypertension, hyperlipidemia presents to the emergency Department chief complaint chest pain. Initial evaluation in the emergency department concerning for ACS and nitroglycerin drip initiated.  Information is obtained from the patient. He reports being in his usual state of health until this morning while he was at work developed sudden onset substernal chest pain. Reports the pain radiated to the right slightly. Describes the pain as constant aching/pressure feeling. Associated symptoms dizziness nausea without vomiting shortness breath. He states breathing deeply made the pain worse. Nothing made it better. He states his next memory is being in the ambulance. No report of syncope. Coworkers were "found him laying on the ground". Reportedly he was holding his chest and sobbing. He denies headache, abnormal pain nausea vomiting dysuria hematuria frequency or urgency. He denies any constipation diarrhea melena or bright red blood per rectum. He denies any lower extremity edema or orthopnea fever chills recent travel or sick contacts    ED Course: In the emergency department he is provided with sublingual nitroglycerin with little relief. He continued to experience chest pain so nitro drip was started. He remained afebrile hemodynamically stable and not hypoxic  Review of Systems: As per HPI otherwise 10 point review of systems negative.   Ambulatory Status: Ambulates independently with a steady gait no recent falls  Past Medical History:  Diagnosis Date  . Bipolar 1 disorder (HCC)   . Hypertension   . Seizure (HCC)   . TIA (transient ischemic attack)      History reviewed. No pertinent surgical history.  Social History   Social History  . Marital status: Single    Spouse name: N/A  . Number of children: N/A  . Years of education: N/A   Occupational History  . Not on file.   Social History Main Topics  . Smoking status: Smoker, Current Status Unknown  . Smokeless tobacco: Not on file  . Alcohol use No  . Drug use:     Types: Marijuana  . Sexual activity: Not on file   Other Topics Concern  . Not on file   Social History Narrative  . No narrative on file  employed at [employer] in [place]  Allergies  Allergen Reactions  . Lactose Intolerance (Gi) Diarrhea and Other (See Comments)    Bloating, GI problems    Family History  Problem Relation Age of Onset  . Hypertension Mother   . Diabetes Mother   . COPD Father   . Pulmonary embolism Father   . Hypertension Sister     Prior to Admission medications   Medication Sig Start Date End Date Taking? Authorizing Provider  amLODipine (NORVASC) 5 MG tablet Take 5 mg by mouth at bedtime.    Yes Historical Provider, MD  aspirin 81 MG chewable tablet Chew 81 mg by mouth at bedtime.    Yes Historical Provider, MD  lamoTRIgine (LAMICTAL) 100 MG tablet Take 100 mg by mouth at bedtime.    Yes Historical Provider, MD  omeprazole (PRILOSEC OTC) 20 MG tablet Take 20 mg by mouth daily as needed (heartburn).    Yes Historical Provider, MD  QUEtiapine (SEROQUEL XR) 300 MG 24 hr tablet Take 1 tablet (300 mg total) by  mouth at bedtime. Patient taking differently: Take 600 mg by mouth at bedtime.  05/17/16  Yes Layne Benton, NP    Physical Exam: Vitals:   09/16/16 1536 09/16/16 1600 09/16/16 1630 09/16/16 1700  BP: 129/79 123/81 125/83 133/83  Pulse: 74 75 73 69  Resp:  16 23 23   Temp:      TempSrc:      SpO2: 99% 98% 99% 100%     General:  Appears calm and comfortable, no acute distress  Eyes:  PERRL, EOMI, normal lids, iris ENT:  grossly normal hearing, lips & tongue, his  membranes of his mouth are moist and pink Neck:  no LAD, masses or thyromegaly Cardiovascular:  RRR, no m/r/g. No LE edema. S nontender to palpation Respiratory:  CTA bilaterally, no w/r/r. Normal respiratory effort. Abdomen:  soft, ntnd, positive bowel sounds throughout no guarding or rebounding Skin:  no rash or induration seen on limited exam Musculoskeletal:  grossly normal tone BUE/BLE, good ROM, no bony abnormality Psychiatric:  grossly normal mood and affect, speech fluent and appropriate, AOx3 Neurologic:  CN 2-12 grossly intact, moves all extremities in coordinated fashion, sensation intact  Labs on Admission: I have personally reviewed following labs and imaging studies  CBC:  Recent Labs Lab 09/16/16 1222  WBC 4.6  HGB 12.6*  HCT 38.0*  MCV 79.5  PLT 221   Basic Metabolic Panel:  Recent Labs Lab 09/16/16 1222  NA 142  K 4.4  CL 111  CO2 24  GLUCOSE 84  BUN 15  CREATININE 1.33*  CALCIUM 9.2   GFR: CrCl cannot be calculated (Unknown ideal weight.). Liver Function Tests: No results for input(s): AST, ALT, ALKPHOS, BILITOT, PROT, ALBUMIN in the last 168 hours. No results for input(s): LIPASE, AMYLASE in the last 168 hours. No results for input(s): AMMONIA in the last 168 hours. Coagulation Profile: No results for input(s): INR, PROTIME in the last 168 hours. Cardiac Enzymes: No results for input(s): CKTOTAL, CKMB, CKMBINDEX, TROPONINI in the last 168 hours. BNP (last 3 results) No results for input(s): PROBNP in the last 8760 hours. HbA1C: No results for input(s): HGBA1C in the last 72 hours. CBG: No results for input(s): GLUCAP in the last 168 hours. Lipid Profile:  Recent Labs  09/16/16 1222  CHOL 161  HDL 37*  LDLCALC 96  TRIG 161  CHOLHDL 4.4   Thyroid Function Tests: No results for input(s): TSH, T4TOTAL, FREET4, T3FREE, THYROIDAB in the last 72 hours. Anemia Panel: No results for input(s): VITAMINB12, FOLATE, FERRITIN, TIBC, IRON,  RETICCTPCT in the last 72 hours. Urine analysis:    Component Value Date/Time   COLORURINE YELLOW 05/15/2016 2209   APPEARANCEUR CLEAR 05/15/2016 2209   LABSPEC 1.027 05/15/2016 2209   PHURINE 5.5 05/15/2016 2209   GLUCOSEU NEGATIVE 05/15/2016 2209   HGBUR TRACE (A) 05/15/2016 2209   BILIRUBINUR NEGATIVE 05/15/2016 2209   KETONESUR NEGATIVE 05/15/2016 2209   PROTEINUR 30 (A) 05/15/2016 2209   NITRITE NEGATIVE 05/15/2016 2209   LEUKOCYTESUR NEGATIVE 05/15/2016 2209    Creatinine Clearance: CrCl cannot be calculated (Unknown ideal weight.).  Sepsis Labs: @LABRCNTIP (procalcitonin:4,lacticidven:4) )No results found for this or any previous visit (from the past 240 hour(s)).   Radiological Exams on Admission: Dg Chest Portable 1 View  Result Date: 09/16/2016 CLINICAL DATA:  Acute substernal chest pain approximately 1 hour ago while at work. Dyspnea and lethargy. EXAM: PORTABLE CHEST 1 VIEW COMPARISON:  None. FINDINGS: The heart size and mediastinal contours are within normal limits.  Both lungs are clear. Old right-sided third through sixth rib fractures are seen. No acute osseous abnormality. No effusion or pneumothorax. IMPRESSION: No active disease. Electronically Signed   By: Tollie Eth M.D.   On: 09/16/2016 12:40    EKG: Independently reviewed. Normal sinus rhythm  Assessment/Plan Principal Problem:   Chest pain Active Problems:   Conversion disorder with weakness or paralysis, acute episode, with psychological stressor   Bipolar I disorder, most recent episode depressed (HCC)   Essential hypertension   Hyperlipidemia LDL goal <100   Chronic kidney disease stage II   #1. Chest pain. Some typical and atypical features. Some concern for conversion episode based on description on presentation.  Initial troponin negative. EKG without acute changes. He denies ever having had stress tests in the past. Heart score 3. NTG drip initiated in the emergency department with little  relief. -Admit to telemetry -Cycle troponin -Serial EKG -Aspirin -Lipid panel -await cardiology recommendations  2. Chronic kidney disease stage II. Creatinine 1.33 on admission which appears to be close to baseline. -Monitor urine output -Gentle IV fluids -Recheck in the morning  3. Hypertension. Controlled in the emergency department. Medications include amlodipine -Continue amlodipine -Monitor  #4. Bipolar disorder/conversion disorder with weakness or paralysis. Chart review indicates history of conversion disorder 2015 and severe stroke symptoms include overnight. Evaluated by [facility] 04/2016 for same -continue home meds. 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 -Continue home meds  DVT prophylaxis:  scd Code Status: full  Family Communication: none present  Disposition Plan: home  Consults called: cardmaster per ED MD  Admission status: obs    Gwenyth Bender MD [facility]  If 7PM-7AM, please contact night-coverage [email or web address] Password [email or web address]  09/16/2016, 5:34 PM

## 2016-09-16 NOTE — Consult Note (Signed)
CARDIOLOGY CONSULT NOTE   Patient ID: Benjamin Moon MRN: [id number], DOB/AGE: July 11, 1966   Admit date: 09/16/2016 Date of Consult: 09/16/2016   Primary Physician: No PCP Per Patient Primary Cardiologist: new   Pt. Profile  [name] is a 50 year old African-American male with past medical history of bipolar 1 disorder, HTN, TIA, seizure and HLD presented from his workplace the [employer] with chest pain.  Problem List  Past Medical History:  Diagnosis Date  . Bipolar 1 disorder (HCC)   . Hypertension   . Seizure (HCC)   . TIA (transient ischemic attack)     Past Surgical History:  Procedure Laterality Date  . APPENDECTOMY     when he was in his 20-30s     Allergies  Allergies  Allergen Reactions  . Lactose Intolerance (Gi) Diarrhea and Other (See Comments)    Bloating, GI problems    HPI   [name] is a 50 year old African-American male with past medical history of bipolar 1 disorder, HTN, TIA, seizure and HLD. He has not seen a PCP in a long time, he goes to rehabilitation and pick up most of his medications over there. He denies any prior cardiac history. However he says he has had an eight-year history of syncopal episodes that occurs 3-4 times a year. He denies any previous workup on this and states prior to his syncope his prodromal symptom is usually dizziness, weakness and a flushing sensation. He has never had chest pain before. His last episode of syncope was several month ago. His only family history of cardiac issue is his paternal grandmother had MI when she was in her late 5950s. He was seen in May 2017 for strokelike symptom by neurology service, he was also given TPA, eventually diagnosed with conversion syndrome. CT angiogram showed no proximal occlusion or large vessel stenosis. Psychiatry was consulted. Echocardiogram obtained during the admission showed normal ejection fraction.  Otherwise according to the patient, he was in his usual state of health when  he went to work this morning. Around 10:50 AM, he had similar symptom of dizziness and a flushing sensation. This time, it is different in the fact that he started having crushing substernal chest pain. It was worse with deep inspiration, palpation and body rotation, he described it as inhaling glass. It waxed and waned however never went away. According to his coworker, the pain was so intense, he would actually passed out several times on the field, in the EMS truck and also in the ED. Coworker called EMS and [facility]. Initial EKG did not show any significant changes. Troponin obtained one half hour later was negative. Significant laboratory finding include creatinine of 1.3, hemoglobin 12.6, normal lipid profile. Urine drug test was positive for marijuana. Patient was admitted to hospitalist service, cardiology was consulted for chest pain.    Inpatient Medications  . amLODipine  5 mg Oral QHS  . [START ON 09/17/2016] aspirin EC  325 mg Oral Daily  . ketorolac  30 mg Intravenous Once  . lamoTRIgine  100 mg Oral QHS  . pantoprazole  40 mg Oral Daily  . QUEtiapine  600 mg Oral QHS    Family History Family History  Problem Relation Age of Onset  . Hypertension Mother   . Diabetes Mother   . COPD Father   . Pulmonary embolism Father   . Hypertension Sister   . Heart attack Paternal Grandmother     late 2850s  Social History Social History   Social History  . Marital status: Single    Spouse name: N/A  . Number of children: N/A  . Years of education: N/A   Occupational History  . Not on file.   Social History Main Topics  . Smoking status: Smoker, Current Status Unknown  . Smokeless tobacco: Not on file  . Alcohol use No  . Drug use:     Types: Marijuana  . Sexual activity: Not on file   Other Topics Concern  . Not on file   Social History Narrative  . No narrative on file     Review of Systems  General:  No  chills, fever, night sweats or weight changes.  Cardiovascular:  No edema, orthopnea, palpitations, paroxysmal nocturnal dyspnea. +chest pain, dizziness Dermatological: No rash, lesions/masses Respiratory: No cough, dyspnea Urologic: No hematuria, dysuria Abdominal:   No nausea, vomiting, diarrhea, bright red blood per rectum, melena, or hematemesis Neurologic:  No visual changes +wkns, changes in mental status. All other systems reviewed and are otherwise negative except as noted above.  Physical Exam  Blood pressure (!) 142/103, pulse 71, temperature 98.3 F (36.8 C), temperature source Oral, resp. rate 20, SpO2 99 %.  General: Pleasant, NAD Psych: Normal affect. Neuro: Alert and oriented X 3. Moves all extremities spontaneously. HEENT: Normal  Neck: Supple without bruits or JVD. Lungs:  Resp regular and unlabored, CTA. Heart: RRR no s3, s4, or murmurs. Abdomen: Soft, non-tender, non-distended, BS + x 4.  Extremities: No clubbing, cyanosis or edema. DP/PT/Radials 2+ and equal bilaterally.  Labs  No results for input(s): CKTOTAL, CKMB, TROPONINI in the last 72 hours. Lab Results  Component Value Date   WBC 4.6 09/16/2016   HGB 12.6 (L) 09/16/2016   HCT 38.0 (L) 09/16/2016   MCV 79.5 09/16/2016   PLT 221 09/16/2016     Recent Labs Lab 09/16/16 1222  NA 142  K 4.4  CL 111  CO2 24  BUN 15  CREATININE 1.33*  CALCIUM 9.2  GLUCOSE 84   Lab Results  Component Value Date   CHOL 161 09/16/2016   HDL 37 (L) 09/16/2016   LDLCALC 96 09/16/2016   TRIG 140 09/16/2016   No results found for: DDIMER  Radiology/Studies  Dg Chest Portable 1 View  Result Date: 09/16/2016 CLINICAL DATA:  Acute substernal chest pain approximately 1 hour ago while at work. Dyspnea and lethargy. EXAM: PORTABLE CHEST 1 VIEW COMPARISON:  None. FINDINGS: The heart size and mediastinal contours are within normal limits. Both lungs are clear. Old right-sided third through sixth rib fractures are  seen. No acute osseous abnormality. No effusion or pneumothorax. IMPRESSION: No active disease. Electronically Signed   By: Tollie Eth M.D.   On: 09/16/2016 12:40    ECG  No significant ST-T wave changes  ASSESSMENT AND PLAN  1. Atypical chest pain: Worse with deep inspiration, body rotation and palpation. Symptoms are atypical, trend serial troponin, reviewed the recent echo which shows normal EF. If serial troponin is negative, plan for outpatient Myoview  2. Recurrent syncope, further workup per hospitalist service, however based on history, sounds like vasovagal episodes. It appears he also has possible conversion syndrome as well.  3. HTN: Blood pressure mildly elevated in the emergency room, however appears to be emotional as well which likely contributed to the elevation of the blood pressure.  4. HLD: On Lipitor 20 mg daily.  5. Marijuana use: Urine drug test positive for marijuana.   Signed,  Azalee Course, PA-C 09/16/2016, 6:10 PM

## 2016-09-16 NOTE — ED Notes (Signed)
Pt st's he wants to eat.  St's he had rather eat then get NTG.  Dr. Fayrene FearingJames made aware and in to talk to pt.  Pt voices understanding for NTG

## 2016-09-16 NOTE — ED Notes (Signed)
Heparin not dosed at this time. Pt transported to 3W15 on monitor

## 2016-09-16 NOTE — ED Provider Notes (Addendum)
[facility]-EMERGENCY DEPT Provider Note   CSN: [id number] Arrival date & time: 09/16/16  1200     History   Chief Complaint Chief Complaint  Patient presents with  . Chest Pain    HPI Benjamin Moon is a 50 y.o. male.  Patient presents for evaluation of chest pain.  On his arrival he is either unable or unwilling to speak about his episode. He is hyperventilating intermittently. Crying intermittently. Information is obtained at a later time during his ER stay when he is more able to speak and offer details of his history.  Apparently, he works at a saw mill. He states that he was getting behind. He admits getting very frustrated about some poor equipment. He states that he was trying to work harder and getting further behind. He states he started feeling pain "in my entire body". Then states he had pain in the center of his chest. States it made him feel short of breath. This worsened. Apparently he laid down. A coworker came by and found him laying holding his chest and sobbing. He was transferred here by paramedics  He denies past history of known heart disease, panic attacks, hyperventilation. He describes a history of marijuana use. Hypertension, high cholesterol, mild chronic renal disease stage II, conversion disorder with "social stressors". Prior CVA sx given TPA.  HPI  Past Medical History:  Diagnosis Date  . Bipolar 1 disorder (HCC)   . Hypertension   . Seizure (HCC)   . TIA (transient ischemic attack)     Patient Active Problem List   Diagnosis Date Noted  . Essential hypertension 05/17/2016  . Hyperlipidemia LDL goal <100 05/17/2016  . Tobacco use disorder 05/17/2016  . Mild tetrahydrocannabinol (THC) abuse 05/17/2016  . Chronic kidney disease stage II 05/17/2016  . Insomnia 05/17/2016  . Stroke-like symptoms s/p IV tPA 05/16/2016  . Conversion disorder with weakness or paralysis, acute episode, with psychological stressor 05/16/2016  . Bipolar I disorder, most recent  episode depressed (HCC) 05/16/2016    History reviewed. No pertinent surgical history.     Home Medications    Prior to Admission medications   Medication Sig Start Date End Date Taking? Authorizing Provider  amLODipine (NORVASC) 5 MG tablet Take 5 mg by mouth at bedtime.    Yes Historical Provider, MD  aspirin 81 MG chewable tablet Chew 81 mg by mouth at bedtime.    Yes Historical Provider, MD  lamoTRIgine (LAMICTAL) 100 MG tablet Take 100 mg by mouth at bedtime.    Yes Historical Provider, MD  omeprazole (PRILOSEC OTC) 20 MG tablet Take 20 mg by mouth daily as needed (heartburn).    Yes Historical Provider, MD  QUEtiapine (SEROQUEL XR) 300 MG 24 hr tablet Take 1 tablet (300 mg total) by mouth at bedtime. Patient taking differently: Take 600 mg by mouth at bedtime.  05/17/16  Yes Layne Benton, NP  atorvastatin (LIPITOR) 20 MG tablet Take 1 tablet (20 mg total) by mouth daily at 6 PM. Patient not taking: Reported on 09/16/2016 05/17/16   Layne Benton, NP    Family History History reviewed. No pertinent family history.  Social History Social History  Substance Use Topics  . Smoking status: Smoker, Current Status Unknown  . Smokeless tobacco: Not on file  . Alcohol use No     Allergies   Lactose intolerance (gi)   Review of Systems Review of Systems  Constitutional: Negative for appetite change, chills, diaphoresis, fatigue and fever.  HENT: Negative for mouth  sores, sore throat and trouble swallowing.   Eyes: Negative for visual disturbance.  Respiratory: Positive for chest tightness. Negative for cough, shortness of breath and wheezing.   Cardiovascular: Positive for chest pain.  Gastrointestinal: Negative for abdominal distention, abdominal pain, diarrhea, nausea and vomiting.  Endocrine: Negative for polydipsia, polyphagia and polyuria.  Genitourinary: Negative for dysuria, frequency and hematuria.  Musculoskeletal: Positive for myalgias. Negative for gait problem.    Skin: Negative for color change, pallor and rash.  Neurological: Negative for dizziness, syncope, light-headedness and headaches.  Hematological: Does not bruise/bleed easily.  Psychiatric/Behavioral: Negative for behavioral problems and confusion.     Physical Exam Updated Vital Signs BP 129/79 (BP Location: Right Arm)   Pulse 74   Temp 97.8 F (36.6 C) (Oral)   Resp (!) 6   SpO2 99%   Physical Exam  Constitutional: He is oriented to person, place, and time. He appears well-developed and well-nourished. No distress.  Adult African-American male. Hyperventilating intermittently. Sobbing. Holding his hand across his chest. Initially nonverbal.  HENT:  Head: Normocephalic.  Eyes: Conjunctivae are normal. Pupils are equal, round, and reactive to light. No scleral icterus.  Neck: Normal range of motion. Neck supple. No thyromegaly present.  Cardiovascular: Normal rate and regular rhythm.  Exam reveals no gallop and no friction rub.   No murmur heard. Pulmonary/Chest: Effort normal and breath sounds normal. No respiratory distress. He has no wheezes. He has no rales.  Abdominal: Soft. Bowel sounds are normal. He exhibits no distension. There is no tenderness. There is no rebound.  Musculoskeletal: Normal range of motion.  Neurological: He is alert and oriented to person, place, and time.  Skin: Skin is warm and dry. No rash noted.  Psychiatric: His mood appears anxious. His speech is delayed. He is hyperactive and withdrawn.     ED Treatments / Results  Labs (all labs ordered are listed, but only abnormal results are displayed) Labs Reviewed  BASIC METABOLIC PANEL - Abnormal; Notable for the following:       Result Value   Creatinine, Ser 1.33 (*)    All other components within normal limits  CBC - Abnormal; Notable for the following:    Hemoglobin 12.6 (*)    HCT 38.0 (*)    All other components within normal limits  URINE RAPID DRUG SCREEN, HOSP PERFORMED - Abnormal;  Notable for the following:    Tetrahydrocannabinol POSITIVE (*)    All other components within normal limits  ETHANOL  I-STAT TROPOININ, ED    EKG  EKG Interpretation None       Radiology Dg Chest Portable 1 View  Result Date: 09/16/2016 CLINICAL DATA:  Acute substernal chest pain approximately 1 hour ago while at work. Dyspnea and lethargy. EXAM: PORTABLE CHEST 1 VIEW COMPARISON:  None. FINDINGS: The heart size and mediastinal contours are within normal limits. Both lungs are clear. Old right-sided third through sixth rib fractures are seen. No acute osseous abnormality. No effusion or pneumothorax. IMPRESSION: No active disease. Electronically Signed   By: Tollie Eth M.D.   On: 09/16/2016 12:40    Procedures Procedures (including critical care time)  Medications Ordered in ED Medications  LORazepam (ATIVAN) injection 1 mg (1 mg Intravenous Given 09/16/16 1255)  nitroGLYCERIN (NITROSTAT) SL tablet 0.4 mg (0.4 mg Sublingual Given 09/16/16 1511)  ketorolac (TORADOL) 30 MG/ML injection 30 mg (not administered)  nitroGLYCERIN 50 mg in dextrose 5 % 250 mL (0.2 mg/mL) infusion (5 mcg/min Intravenous New Bag/Given 09/16/16 1540)  Initial Impression / Assessment and Plan / ED Course  I have reviewed the triage vital signs and the nursing notes.  Pertinent labs & imaging results that were available during my care of the patient were reviewed by me and considered in my medical decision making (see chart for details).  Clinical Course  Value Comment By Time  DG Chest Portable 1 View (Reviewed) Loren Raceravid Yelverton, MD 09/25 1555    EKG without acute changes. First troponin normal. Patient declines pain medication because of history of "drug abuse". Given Ativan. Given nitroglycerin. Pain is chest described as a 6. Was initiated on a nitro- glycerin drip.    He wil require admission for rule out as he has multiple cardiac risk factors. On re-examining the patient has become awake and  alert and fully verbal. He has an intact neurological exam. His affect is improved. His initial episode may have been some hyperventilation and some of the initial behavior likely secondary to his bipolar disorder and conversion reaction. However he continues to describe chest pain, now with a normal affect and fluent speech.    I have consulted cardiology regarding his chest pain.  I will admit to unassigned medicine.  Final Clinical Impressions(s) / ED Diagnoses   Final diagnoses:  Chest pain, unspecified chest pain type    New Prescriptions New Prescriptions   No medications on file     Rolland PorterMark Darnetta Kesselman, MD 09/16/16 1552    Rolland PorterMark Angell Honse, MD 09/16/16 [phone]

## 2016-09-16 NOTE — Progress Notes (Signed)
ANTICOAGULATION CONSULT NOTE - Initial Consult  Pharmacy Consult for heparin Indication: chest pain/ACS  Allergies  Allergen Reactions  . Lactose Intolerance (Gi) Diarrhea and Other (See Comments)    Bloating, GI problems    Patient Measurements: Heparin Dosing Weight: 111 kg   Vital Signs: Temp: 97.8 F (36.6 C) (09/25 1212) Temp Source: Oral (09/25 1212) BP: 123/81 (09/25 1600) Pulse Rate: 75 (09/25 1600)  Labs:  Recent Labs  09/16/16 1222  HGB 12.6*  HCT 38.0*  PLT 221  CREATININE 1.33*    Medical History: Past Medical History:  Diagnosis Date  . Bipolar 1 disorder (HCC)   . Hypertension   . Seizure (HCC)   . TIA (transient ischemic attack)     Assessment: 50 yo male admitted with chest pain. Troponin negative. No oral anticoagulation prior to admission. History of CVA with tPA given in May 2017. CBC stable and no overt signs or symptoms of bleeding noted.   Goal of Therapy:  Heparin level 0.3-0.7 units/ml Monitor platelets by anticoagulation protocol: Yes   Plan:  Give 4000 units bolus x 1 Start heparin infusion at 1550 units/hr Check anti-Xa level in 6 hours and daily while on heparin Continue to monitor H&H and platelets  York CeriseKatherine Cook, PharmD Pharmacy Resident  Pager [phone] 09/16/16 5:37 PM

## 2016-09-17 LAB — BASIC METABOLIC PANEL
Anion gap: 7 (ref 5–15)
BUN: 14 mg/dL (ref 6–20)
CALCIUM: 9 mg/dL (ref 8.9–10.3)
CO2: 21 mmol/L — ABNORMAL LOW (ref 22–32)
CREATININE: 1.19 mg/dL (ref 0.61–1.24)
Chloride: 111 mmol/L (ref 101–111)
GFR calc Af Amer: >60 mL/min (ref >60)
Glucose, Bld: 107 mg/dL — ABNORMAL HIGH (ref 65–99)
POTASSIUM: 4.3 mmol/L (ref 3.5–5.1)
SODIUM: 139 mmol/L (ref 135–145)

## 2016-09-17 LAB — TROPONIN I
Troponin I: <0.03 ng/mL (ref <0.03)
Troponin I: <0.03 ng/mL (ref <0.03)

## 2016-09-17 MED ORDER — PANTOPRAZOLE SODIUM 40 MG PO TBEC: 40.0000 mg | DELAYED_RELEASE_TABLET | Freq: Every day | ORAL | 0 refills | Status: AC

## 2016-09-17 NOTE — Progress Notes (Signed)
No chest pain Troponins negative Spoke with Dr. Karren CobbleHilty-okay to discharge, cardiology will arrange for outpatient stress test. See discharge summary for details

## 2016-09-17 NOTE — Discharge Summary (Signed)
PATIENT DETAILS Name: Benjamin Moon Age: 50 y.o. Sex: male Date of Birth: 1966-01-06 MRN: [id number]. Admitting Physician: Ozella Rocks, MD PCP:No PCP Per Patient  Admit Date: 09/16/2016 Discharge date: 09/17/2016  Recommendations for Outpatient Follow-up:  1. Follow up with PCP in 1-2 weeks 2. Please obtain BMP/CBC in one week 3. Ensure follow-up with cardiology for outpatient stress test.  Admitted From:  Home  Disposition: Home    Home Health: No  Equipment/Devices: None  Discharge Condition: Stable/ guarded/hospice)  CODE STATUS: FULL CODE  Diet recommendation:  Heart Healthy  Brief Summary: See H&P, Labs, Consult and Test reports for all details in brief, patient was admitted for evaluation of chest pain.   Brief Hospital Course: Chest pain: Felt to be atypical, cardiac enzymes negative. Suspect either musculoskeletal or GI in etiology. Patient works in a lumbar yard, and is very physically active. He was admitted and provided supportive care, cardiology evaluation was completed, recommendations are for outpatient stress test. Spoke with Dr. Rennis Golden today, his office will arrange for outpatient follow-up, he advises no further workup while inpatient. Furthermore, patient feels better and is completely chest pain-free at the time of discharge.  Hypertension: Blood pressure controlled, continue amlodipine  GERD: Given chest pain on presentation- suggest a trial of scheduled Protonix.  History of bipolar disorder/conversion disorder: Continue Seroquel and Lamictal.  Procedures/Studies: None  Discharge Diagnoses:  Principal Problem:   Chest pain Active Problems:   Conversion disorder with weakness or paralysis, acute episode, with psychological stressor   Bipolar I disorder, most recent episode depressed (HCC)   Essential hypertension   Hyperlipidemia LDL goal <100   Chronic kidney disease stage II   Discharge Instructions:  Activity:  As  tolerated with Full fall precautions use walker/cane & assistance as needed   Discharge Instructions    Diet - low sodium heart healthy    Complete by:  As directed    Discharge instructions    Complete by:  As directed    Follow with Primary MD  No PCP Per Patient  and other consultant's as instructed your Hospitalist MD  Please get a complete blood count and chemistry panel checked by your Primary MD at your next visit, and again as instructed by your Primary MD.  Get Medicines reviewed and adjusted: Please take all your medications with you for your next visit with your Primary MD  Laboratory/radiological data: Please request your Primary MD to go over all hospital tests and procedure/radiological results at the follow up, please ask your Primary MD to get all Hospital records sent to his/her office.  In some cases, they will be blood work, cultures and biopsy results pending at the time of your discharge. Please request that your primary care M.D. follows up on these results.  Also Note the following: If you experience worsening of your admission symptoms, develop shortness of breath, life threatening emergency, suicidal or homicidal thoughts you must seek medical attention immediately by calling 911 or calling your MD immediately  if symptoms less severe.  You must read complete instructions/literature along with all the possible adverse reactions/side effects for all the Medicines you take and that have been prescribed to you. Take any new Medicines after you have completely understood and accpet all the possible adverse reactions/side effects.   Do not drive when taking Pain medications or sleeping medications (Benzodaizepines)  Do not take more than prescribed Pain, Sleep and Anxiety Medications. It is not advisable to combine anxiety,sleep and pain medications without  talking with your primary care practitioner  Special Instructions: If you have smoked or chewed Tobacco  in the  last 2 yrs please stop smoking, stop any regular Alcohol  and or any Recreational drug use.  Wear Seat belts while driving.  Please note: You were cared for by a hospitalist during your hospital stay. Once you are discharged, your primary care physician will handle any further medical issues. Please note that NO REFILLS for any discharge medications will be authorized once you are discharged, as it is imperative that you return to your primary care physician (or establish a relationship with a primary care physician if you do not have one) for your post hospital discharge needs so that they can reassess your need for medications and monitor your lab values.   Increase activity slowly    Complete by:  As directed        Medication List    STOP taking these medications   omeprazole 20 MG tablet Commonly known as:  PRILOSEC OTC     TAKE these medications   amLODipine 5 MG tablet Commonly known as:  NORVASC Take 5 mg by mouth at bedtime.   aspirin 81 MG chewable tablet Chew 81 mg by mouth at bedtime.   lamoTRIgine 100 MG tablet Commonly known as:  LAMICTAL Take 100 mg by mouth at bedtime.   pantoprazole 40 MG tablet Commonly known as:  PROTONIX Take 1 tablet (40 mg total) by mouth daily. Start taking on:  09/18/2016   QUEtiapine 300 MG 24 hr tablet Commonly known as:  SEROQUEL XR Take 1 tablet (300 mg total) by mouth at bedtime. What changed:  how much to take      Follow-up Information    [facility] .   Specialty:  Cardiology Why:  Office will call you for a outpatient cardiac stress test, if you do not hear from them in 1 week, please give the office a call Contact information: [address] [place] [address] [phone]       Primary care M.D.. Schedule an appointment as soon as possible for a visit in 1 week(s).          Allergies  Allergen Reactions  . Lactose Intolerance (Gi) Diarrhea and Other (See  Comments)    Bloating, GI problems    Consultations:   cardiology  Other Procedures/Studies: Dg Chest Portable 1 View  Result Date: 09/16/2016 CLINICAL DATA:  Acute substernal chest pain approximately 1 hour ago while at work. Dyspnea and lethargy. EXAM: PORTABLE CHEST 1 VIEW COMPARISON:  None. FINDINGS: The heart size and mediastinal contours are within normal limits. Both lungs are clear. Old right-sided third through sixth rib fractures are seen. No acute osseous abnormality. No effusion or pneumothorax. IMPRESSION: No active disease. Electronically Signed   By: Tollie Eth M.D.   On: 09/16/2016 12:40      TODAY-DAY OF DISCHARGE:  Subjective:   Benjamin Moon today has no headache,no chest abdominal pain,no new weakness tingling or numbness, feels much better wants to go home today.   Objective:   Blood pressure 134/74, pulse 64, temperature 98.4 F (36.9 C), temperature source Oral, resp. rate 19, height 6\' 3"  (1.905 m), weight 121.8 kg (268 lb 8 oz), SpO2 98 %.  Intake/Output Summary (Last 24 hours) at 09/17/16 1006 Last data filed at 09/17/16 0900  Gross per 24 hour  Intake           616.38 ml  Output              650 ml  Net           -33.62 ml   Filed Weights   09/16/16 1803 09/17/16 0530  Weight: 119.2 kg (262 lb 12.8 oz) 121.8 kg (268 lb 8 oz)    Exam: Awake Alert, Oriented *3, No new F.N deficits, Normal affect Tat [place].AT,PERRAL Supple Neck,No JVD, No cervical lymphadenopathy appriciated.  Symmetrical Chest wall movement, Good air movement bilaterally, CTAB RRR,No Gallops,Rubs or new Murmurs, No Parasternal Heave +ve B.Sounds, Abd Soft, Non tender, No organomegaly appriciated, No rebound -guarding or rigidity. No Cyanosis, Clubbing or edema, No new Rash or bruise   PERTINENT RADIOLOGIC STUDIES: Dg Chest Portable 1 View  Result Date: 09/16/2016 CLINICAL DATA:  Acute substernal chest pain approximately 1 hour ago while at work. Dyspnea and lethargy. EXAM:  PORTABLE CHEST 1 VIEW COMPARISON:  None. FINDINGS: The heart size and mediastinal contours are within normal limits. Both lungs are clear. Old right-sided third through sixth rib fractures are seen. No acute osseous abnormality. No effusion or pneumothorax. IMPRESSION: No active disease. Electronically Signed   By: Tollie Eth M.D.   On: 09/16/2016 12:40     PERTINENT LAB RESULTS: CBC:  Recent Labs  09/16/16 1222  WBC 4.6  HGB 12.6*  HCT 38.0*  PLT 221   CMET CMP     Component Value Date/Time   NA 139 09/17/2016 0817   K 4.3 09/17/2016 0817   CL 111 09/17/2016 0817   CO2 21 (L) 09/17/2016 0817   GLUCOSE 107 (H) 09/17/2016 0817   BUN 14 09/17/2016 0817   CREATININE 1.19 09/17/2016 0817   CALCIUM 9.0 09/17/2016 0817   PROT 6.3 (L) 05/15/2016 2130   ALBUMIN 4.1 05/15/2016 2130   AST 62 (H) 05/15/2016 2130   ALT 33 05/15/2016 2130   ALKPHOS 67 05/15/2016 2130   BILITOT 0.7 05/15/2016 2130   GFRNONAA >60 09/17/2016 0817   GFRAA >60 09/17/2016 0817    GFR Estimated Creatinine Clearance: 103.3 mL/min (by C-G formula based on SCr of 1.19 mg/dL). No results for input(s): LIPASE, AMYLASE in the last 72 hours.  Recent Labs  09/16/16 1807 09/17/16 0021 09/17/16 0817  TROPONINI <0.03 <0.03 <0.03   Invalid input(s): POCBNP No results for input(s): DDIMER in the last 72 hours. No results for input(s): HGBA1C in the last 72 hours.  Recent Labs  09/16/16 1222  CHOL 161  HDL 37*  LDLCALC 96  TRIG 161  CHOLHDL 4.4   No results for input(s): TSH, T4TOTAL, T3FREE, THYROIDAB in the last 72 hours.  Invalid input(s): FREET3 No results for input(s): VITAMINB12, FOLATE, FERRITIN, TIBC, IRON, RETICCTPCT in the last 72 hours. Coags: No results for input(s): INR in the last 72 hours.  Invalid input(s): PT Microbiology: No results found for this or any previous visit (from the past 240 hour(s)).  FURTHER DISCHARGE INSTRUCTIONS:  Get Medicines reviewed and adjusted: Please  take all your medications with you for your next visit with your Primary MD  Laboratory/radiological data: Please request your Primary MD to go over all hospital tests and procedure/radiological results at the follow up, please ask your Primary MD to get all Hospital records sent to his/her office.  In some cases, they will be blood work, cultures and biopsy results pending at the time of your discharge. Please request that your primary care M.D. goes through all the records of your hospital data and follows up on  these results.  Also Note the following: If you experience worsening of your admission symptoms, develop shortness of breath, life threatening emergency, suicidal or homicidal thoughts you must seek medical attention immediately by calling 911 or calling your MD immediately  if symptoms less severe.  You must read complete instructions/literature along with all the possible adverse reactions/side effects for all the Medicines you take and that have been prescribed to you. Take any new Medicines after you have completely understood and accpet all the possible adverse reactions/side effects.   Do not drive when taking Pain medications or sleeping medications (Benzodaizepines)  Do not take more than prescribed Pain, Sleep and Anxiety Medications. It is not advisable to combine anxiety,sleep and pain medications without talking with your primary care practitioner  Special Instructions: If you have smoked or chewed Tobacco  in the last 2 yrs please stop smoking, stop any regular Alcohol  and or any Recreational drug use.  Wear Seat belts while driving.  Please note: You were cared for by a hospitalist during your hospital stay. Once you are discharged, your primary care physician will handle any further medical issues. Please note that NO REFILLS for any discharge medications will be authorized once you are discharged, as it is imperative that you return to your primary care physician (or  establish a relationship with a primary care physician if you do not have one) for your post hospital discharge needs so that they can reassess your need for medications and monitor your lab values.  Total Time spent coordinating discharge including counseling, education and face to face time equals 25  minutes.  SignedJeoffrey Massed: Benjamin Moon 09/17/2016 10:06 AM

## 2016-09-17 NOTE — Discharge Instructions (Signed)
Heart-Healthy Eating Plan °Many factors influence your heart health, including eating and exercise habits. Heart (coronary) risk increases with abnormal blood fat (lipid) levels. Heart-healthy meal planning includes limiting unhealthy fats, increasing healthy fats, and making other small dietary changes. This includes maintaining a healthy body weight to help keep lipid levels within a normal range. °WHAT IS MY PLAN?  °Your health care provider recommends that you: °· Get no more than _________% of the total calories in your daily diet from fat. °· Limit your intake of saturated fat to less than _________% of your total calories each day. °· Limit the amount of cholesterol in your diet to less than _________ mg per day. °WHAT TYPES OF FAT SHOULD I CHOOSE? °· Choose healthy fats more often. Choose monounsaturated and polyunsaturated fats, such as olive oil and canola oil, flaxseeds, walnuts, almonds, and seeds. °· Eat more omega-3 fats. Good choices include salmon, mackerel, sardines, tuna, flaxseed oil, and ground flaxseeds. Aim to eat fish at least two times each week. °· Limit saturated fats. Saturated fats are primarily found in animal products, such as meats, butter, and cream. Plant sources of saturated fats include palm oil, palm kernel oil, and coconut oil. °· Avoid foods with partially hydrogenated oils in them. These contain trans fats. Examples of foods that contain trans fats are stick margarine, some tub margarines, cookies, crackers, and other baked goods. °WHAT GENERAL GUIDELINES DO I NEED TO FOLLOW? °· Check food labels carefully to identify foods with trans fats or high amounts of saturated fat. °· Fill one half of your plate with vegetables and green salads. Eat 4-5 servings of vegetables per day. A serving of vegetables equals 1 cup of raw leafy vegetables, ½ cup of raw or cooked cut-up vegetables, or ½ cup of vegetable juice. °· Fill one fourth of your plate with whole grains. Look for the word  "whole" as the first word in the ingredient list. °· Fill one fourth of your plate with lean protein foods. °· Eat 4-5 servings of fruit per day. A serving of fruit equals one medium whole fruit, ¼ cup of dried fruit, ½ cup of fresh, frozen, or canned fruit, or ½ cup of 100% fruit juice. °· Eat more foods that contain soluble fiber. Examples of foods that contain this type of fiber are apples, broccoli, carrots, beans, peas, and barley. Aim to get 20-30 g of fiber per day. °· Eat more home-cooked food and less restaurant, buffet, and fast food. °· Limit or avoid alcohol. °· Limit foods that are high in starch and sugar. °· Avoid fried foods. °· Cook foods by using methods other than frying. Baking, boiling, grilling, and broiling are all great options. Other fat-reducing suggestions include: °¨ Removing the skin from poultry. °¨ Removing all visible fats from meats. °¨ Skimming the fat off of stews, soups, and gravies before serving them. °¨ Steaming vegetables in water or broth. °· Lose weight if you are overweight. Losing just 5-10% of your initial body weight can help your overall health and prevent diseases such as diabetes and heart disease. °· Increase your consumption of nuts, legumes, and seeds to 4-5 servings per week. One serving of dried beans or legumes equals ½ cup after being cooked, one serving of nuts equals 1½ ounces, and one serving of seeds equals ½ ounce or 1 tablespoon. °· You may need to monitor your salt (sodium) intake, especially if you have high blood pressure. Talk with your health care provider or dietitian to get   more information about reducing sodium. °WHAT FOODS CAN I EAT? °Grains °Breads, including French, white, pita, wheat, raisin, rye, oatmeal, and Italian. Tortillas that are neither fried nor made with lard or trans fat. Low-fat rolls, including hotdog and hamburger buns and English muffins. Biscuits. Muffins. Waffles. Pancakes. Light popcorn. Whole-grain cereals. Flatbread. Melba  toast. Pretzels. Breadsticks. Rusks. Low-fat snacks and crackers, including oyster, saltine, matzo, graham, animal, and rye. Rice and pasta, including brown rice and those that are made with whole wheat. °Vegetables °All vegetables. °Fruits °All fruits, but limit coconut. °Meats and Other Protein Sources °Lean, well-trimmed beef, veal, pork, and lamb. Chicken and turkey without skin. All fish and shellfish. Wild duck, rabbit, pheasant, and venison. Egg whites or low-cholesterol egg substitutes. Dried beans, peas, lentils, and tofu. Seeds and most nuts. °Dairy °Low-fat or nonfat cheeses, including ricotta, string, and mozzarella. Skim or 1% milk that is liquid, powdered, or evaporated. Buttermilk that is made with low-fat milk. Nonfat or low-fat yogurt. °Beverages °Mineral water. Diet carbonated beverages. °Sweets and Desserts °Sherbets and fruit ices. Honey, jam, marmalade, jelly, and syrups. Meringues and gelatins. Pure sugar candy, such as hard candy, jelly beans, gumdrops, mints, marshmallows, and small amounts of dark chocolate. Angel food cake. °Eat all sweets and desserts in moderation. °Fats and Oils °Nonhydrogenated (trans-free) margarines. Vegetable oils, including soybean, sesame, sunflower, olive, peanut, safflower, corn, canola, and cottonseed. Salad dressings or mayonnaise that are made with a vegetable oil. Limit added fats and oils that you use for cooking, baking, salads, and as spreads. °Other °Cocoa powder. Coffee and tea. All seasonings and condiments. °The items listed above may not be a complete list of recommended foods or beverages. Contact your dietitian for more options. °WHAT FOODS ARE NOT RECOMMENDED? °Grains °Breads that are made with saturated or trans fats, oils, or whole milk. Croissants. Butter rolls. Cheese breads. Sweet rolls. Donuts. Buttered popcorn. Chow mein noodles. High-fat crackers, such as cheese or butter crackers. °Meats and Other Protein Sources °Fatty meats, such as  hotdogs, short ribs, sausage, spareribs, bacon, ribeye roast or steak, and mutton. High-fat deli meats, such as salami and bologna. Caviar. Domestic duck and goose. Organ meats, such as kidney, liver, sweetbreads, brains, gizzard, chitterlings, and heart. °Dairy °Cream, sour cream, cream cheese, and creamed cottage cheese. Whole milk cheeses, including blue (bleu), Monterey Jack, Brie, [place], American, Havarti, Swiss, cheddar, Camembert, and [place].  Whole or 2% milk that is liquid, evaporated, or condensed. Whole buttermilk. Cream sauce or high-fat cheese sauce. Yogurt that is made from whole milk. °Beverages °Regular sodas and drinks with added sugar. °Sweets and Desserts °Frosting. Pudding. Cookies. Cakes other than angel food cake. Candy that has milk chocolate or white chocolate, hydrogenated fat, butter, coconut, or unknown ingredients. Buttered syrups. Full-fat ice cream or ice cream drinks. °Fats and Oils °Gravy that has suet, meat fat, or shortening. Cocoa butter, hydrogenated oils, palm oil, coconut oil, palm kernel oil. These can often be found in baked products, candy, fried foods, nondairy creamers, and whipped toppings. Solid fats and shortenings, including bacon fat, salt pork, lard, and butter. Nondairy cream substitutes, such as coffee creamers and sour cream substitutes. Salad dressings that are made of unknown oils, cheese, or sour cream. °The items listed above may not be a complete list of foods and beverages to avoid. Contact your dietitian for more information. °  °This information is not intended to replace advice given to you by your health care provider. Make sure you discuss any questions you have with your health   care provider. °  °Document Released: 09/17/2008 Document Revised: 12/30/2014 Document Reviewed: 06/02/2014 °[employer] Interactive Patient Education ©2016 [employer] ° °

## 2017-02-18 DIAGNOSIS — R569 Unspecified convulsions: Secondary | ICD-10-CM

## 2017-02-18 DIAGNOSIS — E669 Obesity, unspecified: Secondary | ICD-10-CM

## 2017-02-18 DIAGNOSIS — F449 Dissociative and conversion disorder, unspecified: Secondary | ICD-10-CM

## 2017-02-18 DIAGNOSIS — R202 Paresthesia of skin: Secondary | ICD-10-CM

## 2017-02-18 DIAGNOSIS — F1990 Other psychoactive substance use, unspecified, uncomplicated: Secondary | ICD-10-CM

## 2017-02-18 DIAGNOSIS — M1990 Unspecified osteoarthritis, unspecified site: Secondary | ICD-10-CM

## 2017-02-18 DIAGNOSIS — R531 Weakness: Secondary | ICD-10-CM

## 2017-02-18 DIAGNOSIS — R7989 Other specified abnormal findings of blood chemistry: Secondary | ICD-10-CM

## 2017-02-18 DIAGNOSIS — F319 Bipolar disorder, unspecified: Secondary | ICD-10-CM

## 2018-02-05 IMAGING — CR DG CHEST 1V PORT
1 series · 1 of 1 positions shown · non-contrast
Comparison: None.

CLINICAL DATA: Acute substernal chest pain approximately 1 hour ago
while at work. Dyspnea and lethargy.

EXAM:
PORTABLE CHEST 1 VIEW

[AP]
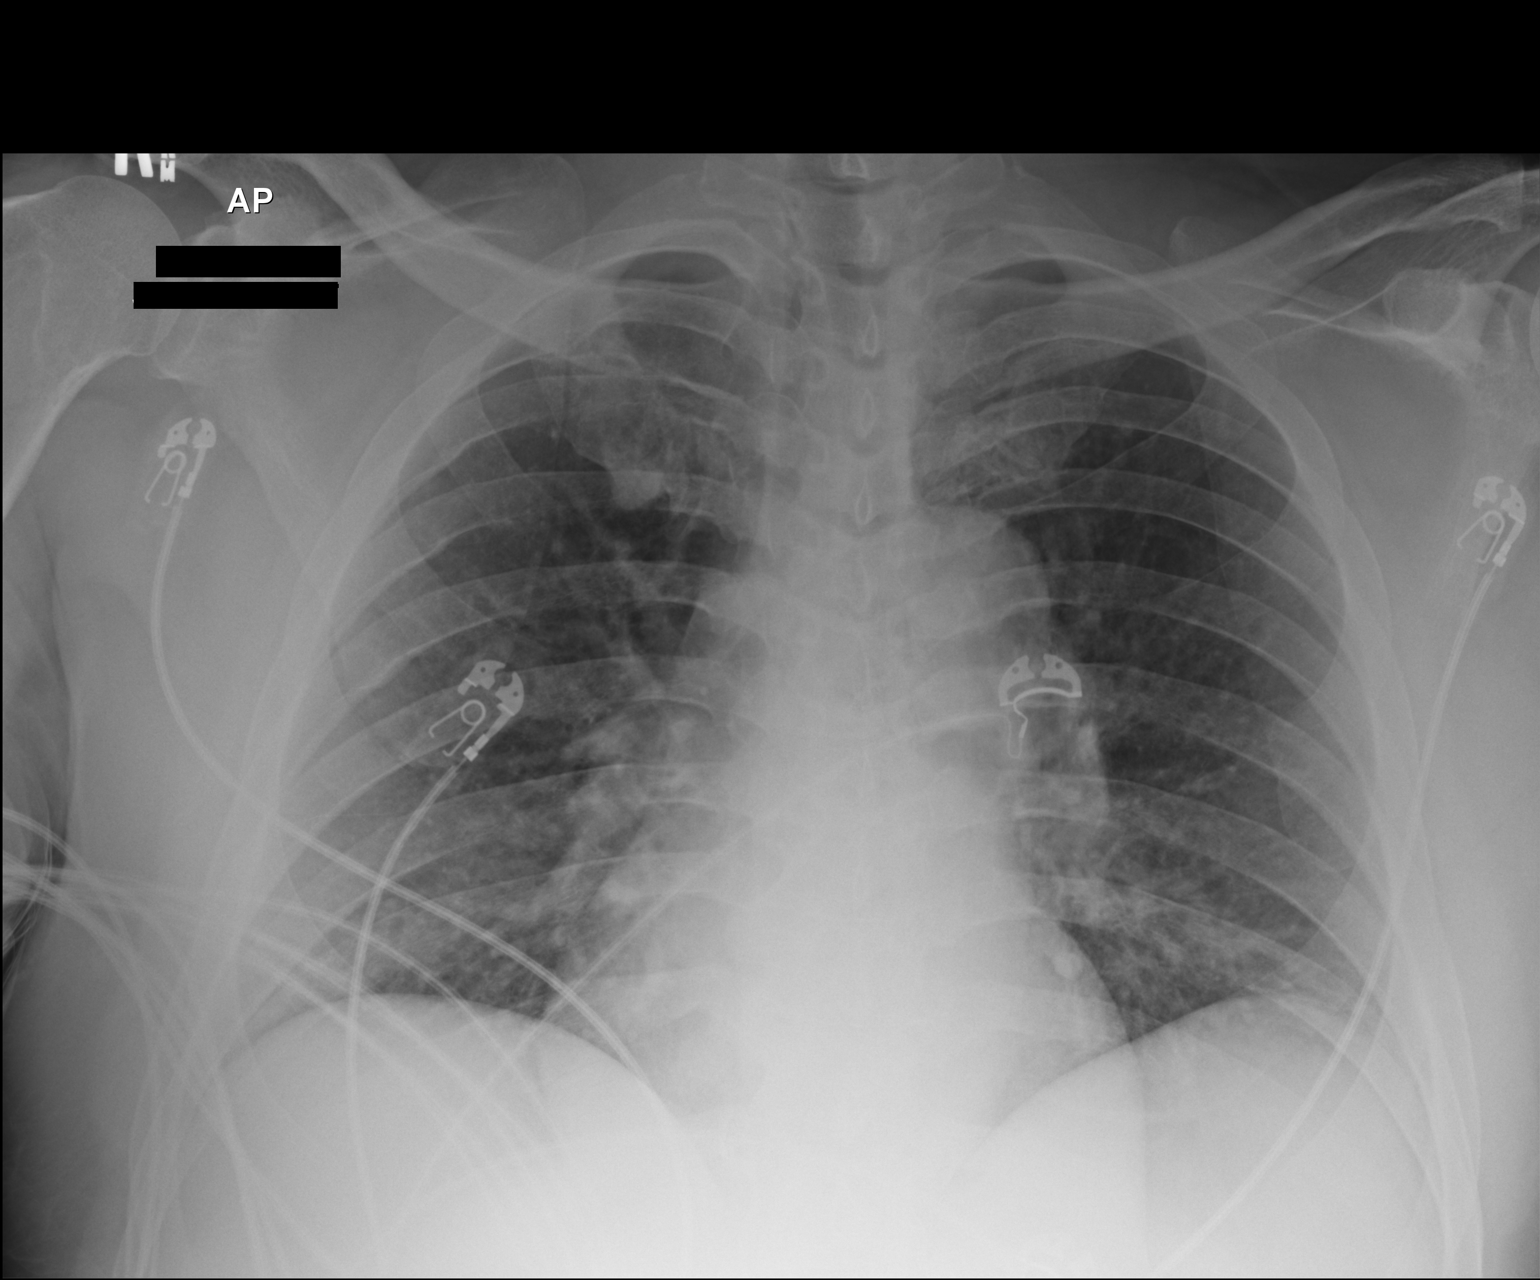

[1 of 1 positions shown; findings below may reference images not displayed]

FINDINGS: The heart size and mediastinal contours are within normal limits.
Both lungs are clear. Old right-sided third through sixth rib
fractures are seen. No acute osseous abnormality. No effusion or
pneumothorax.
IMPRESSION: No active disease.
